# Patient Record
Sex: Male | Born: 1970 | Race: White | Hispanic: No | Marital: Single | Smoking: Current every day smoker
Health system: Southern US, Community
[De-identification: ages and names within clinical notes are randomized; demographics above are authoritative.]

## PROBLEM LIST (undated history)

## (undated) DIAGNOSIS — F1010 Alcohol abuse, uncomplicated: Secondary | ICD-10-CM

## (undated) DIAGNOSIS — J189 Pneumonia, unspecified organism: Secondary | ICD-10-CM

## (undated) DIAGNOSIS — I10 Essential (primary) hypertension: Secondary | ICD-10-CM

## (undated) HISTORY — PX: BACK SURGERY: SHX140

---

## 2020-08-27 ENCOUNTER — Emergency Department (HOSPITAL_BASED_OUTPATIENT_CLINIC_OR_DEPARTMENT_OTHER): Payer: BC Managed Care – PPO

## 2020-08-27 ENCOUNTER — Inpatient Hospital Stay (HOSPITAL_BASED_OUTPATIENT_CLINIC_OR_DEPARTMENT_OTHER)
Admission: EM | Admit: 2020-08-27 | Discharge: 2020-09-19 | DRG: 329 | Disposition: A | Discharge status: Home Health | Payer: BC Managed Care – PPO | Attending: Surgery | Admitting: Surgery

## 2020-08-27 ENCOUNTER — Other Ambulatory Visit: Payer: Self-pay

## 2020-08-27 ENCOUNTER — Encounter (HOSPITAL_BASED_OUTPATIENT_CLINIC_OR_DEPARTMENT_OTHER): Payer: Self-pay

## 2020-08-27 DIAGNOSIS — E871 Hypo-osmolality and hyponatremia: Secondary | ICD-10-CM | POA: Diagnosis present

## 2020-08-27 DIAGNOSIS — K2970 Gastritis, unspecified, without bleeding: Secondary | ICD-10-CM | POA: Diagnosis present

## 2020-08-27 DIAGNOSIS — K760 Fatty (change of) liver, not elsewhere classified: Secondary | ICD-10-CM | POA: Diagnosis present

## 2020-08-27 DIAGNOSIS — Z8616 Personal history of COVID-19: Secondary | ICD-10-CM

## 2020-08-27 DIAGNOSIS — K449 Diaphragmatic hernia without obstruction or gangrene: Secondary | ICD-10-CM | POA: Diagnosis present

## 2020-08-27 DIAGNOSIS — K3184 Gastroparesis: Secondary | ICD-10-CM | POA: Diagnosis present

## 2020-08-27 DIAGNOSIS — Z20822 Contact with and (suspected) exposure to covid-19: Secondary | ICD-10-CM | POA: Diagnosis present

## 2020-08-27 DIAGNOSIS — Z0189 Encounter for other specified special examinations: Secondary | ICD-10-CM

## 2020-08-27 DIAGNOSIS — K2980 Duodenitis without bleeding: Secondary | ICD-10-CM | POA: Diagnosis present

## 2020-08-27 DIAGNOSIS — K56609 Unspecified intestinal obstruction, unspecified as to partial versus complete obstruction: Secondary | ICD-10-CM | POA: Diagnosis present

## 2020-08-27 DIAGNOSIS — F1010 Alcohol abuse, uncomplicated: Secondary | ICD-10-CM | POA: Diagnosis present

## 2020-08-27 DIAGNOSIS — D72829 Elevated white blood cell count, unspecified: Secondary | ICD-10-CM | POA: Diagnosis present

## 2020-08-27 DIAGNOSIS — K56699 Other intestinal obstruction unspecified as to partial versus complete obstruction: Secondary | ICD-10-CM | POA: Diagnosis not present

## 2020-08-27 DIAGNOSIS — Z789 Other specified health status: Secondary | ICD-10-CM

## 2020-08-27 DIAGNOSIS — I10 Essential (primary) hypertension: Secondary | ICD-10-CM | POA: Diagnosis present

## 2020-08-27 DIAGNOSIS — F17210 Nicotine dependence, cigarettes, uncomplicated: Secondary | ICD-10-CM | POA: Diagnosis present

## 2020-08-27 DIAGNOSIS — K567 Ileus, unspecified: Secondary | ICD-10-CM | POA: Diagnosis not present

## 2020-08-27 DIAGNOSIS — E43 Unspecified severe protein-calorie malnutrition: Secondary | ICD-10-CM | POA: Diagnosis present

## 2020-08-27 DIAGNOSIS — Z6823 Body mass index (BMI) 23.0-23.9, adult: Secondary | ICD-10-CM | POA: Diagnosis not present

## 2020-08-27 DIAGNOSIS — Z79899 Other long term (current) drug therapy: Secondary | ICD-10-CM

## 2020-08-27 DIAGNOSIS — E876 Hypokalemia: Secondary | ICD-10-CM | POA: Diagnosis not present

## 2020-08-27 DIAGNOSIS — R1310 Dysphagia, unspecified: Secondary | ICD-10-CM | POA: Diagnosis present

## 2020-08-27 DIAGNOSIS — R112 Nausea with vomiting, unspecified: Secondary | ICD-10-CM

## 2020-08-27 DIAGNOSIS — K5792 Diverticulitis of intestine, part unspecified, without perforation or abscess without bleeding: Secondary | ICD-10-CM | POA: Diagnosis present

## 2020-08-27 DIAGNOSIS — Z978 Presence of other specified devices: Secondary | ICD-10-CM

## 2020-08-27 DIAGNOSIS — K9189 Other postprocedural complications and disorders of digestive system: Secondary | ICD-10-CM

## 2020-08-27 DIAGNOSIS — K3189 Other diseases of stomach and duodenum: Secondary | ICD-10-CM | POA: Diagnosis not present

## 2020-08-27 DIAGNOSIS — K2210 Ulcer of esophagus without bleeding: Secondary | ICD-10-CM | POA: Diagnosis not present

## 2020-08-27 DIAGNOSIS — R935 Abnormal findings on diagnostic imaging of other abdominal regions, including retroperitoneum: Secondary | ICD-10-CM | POA: Diagnosis not present

## 2020-08-27 DIAGNOSIS — D7589 Other specified diseases of blood and blood-forming organs: Secondary | ICD-10-CM | POA: Diagnosis present

## 2020-08-27 DIAGNOSIS — K5720 Diverticulitis of large intestine with perforation and abscess without bleeding: Secondary | ICD-10-CM | POA: Diagnosis present

## 2020-08-27 HISTORY — DX: Essential (primary) hypertension: I10

## 2020-08-27 HISTORY — DX: Alcohol abuse, uncomplicated: F10.10

## 2020-08-27 LAB — CBC WITH DIFFERENTIAL/PLATELET
Abs Immature Granulocytes: 0.07 10*3/uL (ref 0.00–0.07)
Basophils Absolute: 0.1 10*3/uL (ref 0.0–0.1)
Basophils Relative: 0 %
Eosinophils Absolute: 0.1 10*3/uL (ref 0.0–0.5)
Eosinophils Relative: 1 %
HCT: 40.1 % (ref 39.0–52.0)
Hemoglobin: 13.6 g/dL (ref 13.0–17.0)
Immature Granulocytes: 0 %
Lymphocytes Relative: 8 %
Lymphs Abs: 1.5 10*3/uL (ref 0.7–4.0)
MCH: 35.6 pg — ABNORMAL HIGH (ref 26.0–34.0)
MCHC: 33.9 g/dL (ref 30.0–36.0)
MCV: 105 fL — ABNORMAL HIGH (ref 80.0–100.0)
Monocytes Absolute: 1.2 10*3/uL — ABNORMAL HIGH (ref 0.1–1.0)
Monocytes Relative: 6 %
Neutro Abs: 16.8 10*3/uL — ABNORMAL HIGH (ref 1.7–7.7)
Neutrophils Relative %: 85 %
Platelets: 690 10*3/uL — ABNORMAL HIGH (ref 150–400)
RBC: 3.82 MIL/uL — ABNORMAL LOW (ref 4.22–5.81)
RDW: 14.4 % (ref 11.5–15.5)
WBC: 19.7 10*3/uL — ABNORMAL HIGH (ref 4.0–10.5)
nRBC: 0 % (ref 0.0–0.2)

## 2020-08-27 LAB — COMPREHENSIVE METABOLIC PANEL
ALT: 39 U/L (ref 0–44)
AST: 26 U/L (ref 15–41)
Albumin: 3.7 g/dL (ref 3.5–5.0)
Alkaline Phosphatase: 78 U/L (ref 38–126)
Anion gap: 9 (ref 5–15)
BUN: 9 mg/dL (ref 6–20)
CO2: 31 mmol/L (ref 22–32)
Calcium: 9.8 mg/dL (ref 8.9–10.3)
Chloride: 92 mmol/L — ABNORMAL LOW (ref 98–111)
Creatinine, Ser: 0.69 mg/dL (ref 0.61–1.24)
GFR, Estimated: >60 mL/min (ref >60)
Glucose, Bld: 108 mg/dL — ABNORMAL HIGH (ref 70–99)
Potassium: 4.2 mmol/L (ref 3.5–5.1)
Sodium: 132 mmol/L — ABNORMAL LOW (ref 135–145)
Total Bilirubin: 0.7 mg/dL (ref 0.3–1.2)
Total Protein: 7.5 g/dL (ref 6.5–8.1)

## 2020-08-27 LAB — RESP PANEL BY RT-PCR (FLU A&B, COVID) ARPGX2
Influenza A by PCR: NEGATIVE
Influenza B by PCR: NEGATIVE
SARS Coronavirus 2 by RT PCR: NEGATIVE

## 2020-08-27 LAB — LIPASE, BLOOD: Lipase: 25 U/L (ref 11–51)

## 2020-08-27 MED ORDER — DIPHENHYDRAMINE HCL 12.5 MG/5ML PO ELIX: 12.5000 mg | ORAL_SOLUTION | Freq: Four times a day (QID) | ORAL | Status: DC | PRN

## 2020-08-27 MED ORDER — SODIUM CHLORIDE 0.9 % IV SOLN: INTRAVENOUS | Status: DC

## 2020-08-27 MED ORDER — SODIUM CHLORIDE 0.9 % IV SOLN
INTRAVENOUS | Status: AC
  Administered 2020-08-28: 999 mL via INTRAVENOUS
  Administered 2020-09-01: 100 mL/h via INTRAVENOUS

## 2020-08-27 MED ORDER — METOPROLOL TARTRATE 25 MG PO TABS: 25.0000 mg | ORAL_TABLET | Freq: Two times a day (BID) | ORAL | Status: DC

## 2020-08-27 MED ORDER — ACETAMINOPHEN 325 MG PO TABS: 650.0000 mg | ORAL_TABLET | Freq: Four times a day (QID) | ORAL | Status: DC | PRN

## 2020-08-27 MED ORDER — PROCHLORPERAZINE MALEATE 10 MG PO TABS
10.0000 mg | ORAL_TABLET | Freq: Four times a day (QID) | ORAL | Status: DC | PRN
  Filled 2020-08-27: qty 1

## 2020-08-27 MED ORDER — TRAZODONE HCL 50 MG PO TABS
50.0000 mg | ORAL_TABLET | Freq: Every evening | ORAL | Status: DC | PRN
  Administered 2020-09-16 – 2020-09-18 (×3): 50 mg via ORAL
  Filled 2020-08-27 (×6): qty 1

## 2020-08-27 MED ORDER — DIAZEPAM 5 MG/ML PO CONC
10.0000 mg | Freq: Every day | ORAL | Status: DC | PRN
  Filled 2020-08-27: qty 2

## 2020-08-27 MED ORDER — KCL IN DEXTROSE-NACL 20-5-0.45 MEQ/L-%-% IV SOLN
INTRAVENOUS | Status: DC
  Filled 2020-08-27: qty 1000

## 2020-08-27 MED ORDER — METHOCARBAMOL 1000 MG/10ML IJ SOLN
500.0000 mg | Freq: Four times a day (QID) | INTRAVENOUS | Status: DC | PRN
  Administered 2020-08-27 – 2020-08-29 (×3): 500 mg via INTRAVENOUS
  Filled 2020-08-27: qty 5
  Filled 2020-08-27 (×2): qty 500

## 2020-08-27 MED ORDER — PROCHLORPERAZINE EDISYLATE 10 MG/2ML IJ SOLN
5.0000 mg | Freq: Four times a day (QID) | INTRAMUSCULAR | Status: DC | PRN
  Administered 2020-09-01 – 2020-09-02 (×3): 10 mg via INTRAVENOUS
  Filled 2020-08-27 (×3): qty 2

## 2020-08-27 MED ORDER — PIPERACILLIN-TAZOBACTAM 3.375 G IVPB
3.3750 g | Freq: Once | INTRAVENOUS | Status: AC
  Administered 2020-08-27: 3.375 g via INTRAVENOUS
  Filled 2020-08-27: qty 50

## 2020-08-27 MED ORDER — PANTOPRAZOLE SODIUM 40 MG IV SOLR
40.0000 mg | Freq: Every day | INTRAVENOUS | Status: DC
  Administered 2020-08-28 – 2020-09-17 (×22): 40 mg via INTRAVENOUS
  Filled 2020-08-27 (×22): qty 40

## 2020-08-27 MED ORDER — AMLODIPINE BESYLATE 5 MG PO TABS
5.0000 mg | ORAL_TABLET | Freq: Every day | ORAL | Status: DC
  Administered 2020-08-28 – 2020-09-19 (×21): 5 mg via ORAL
  Filled 2020-08-27 (×22): qty 1

## 2020-08-27 MED ORDER — ENOXAPARIN SODIUM 40 MG/0.4ML IJ SOSY
40.0000 mg | PREFILLED_SYRINGE | INTRAMUSCULAR | Status: DC
  Administered 2020-08-28: 40 mg via SUBCUTANEOUS
  Filled 2020-08-27: qty 0.4

## 2020-08-27 MED ORDER — PIPERACILLIN-TAZOBACTAM 3.375 G IVPB
3.3750 g | Freq: Three times a day (TID) | INTRAVENOUS | Status: DC
  Administered 2020-08-29 – 2020-09-06 (×26): 3.375 g via INTRAVENOUS
  Filled 2020-08-27 (×26): qty 50

## 2020-08-27 MED ORDER — ACETAMINOPHEN 650 MG RE SUPP: 650.0000 mg | Freq: Four times a day (QID) | RECTAL | Status: DC | PRN

## 2020-08-27 MED ORDER — HYDROMORPHONE HCL 1 MG/ML IJ SOLN
0.5000 mg | INTRAMUSCULAR | Status: DC | PRN
  Administered 2020-08-27: 0.5 mg via INTRAVENOUS
  Administered 2020-08-27 – 2020-08-28 (×2): 1 mg via INTRAVENOUS
  Administered 2020-08-28 (×3): 2 mg via INTRAVENOUS
  Administered 2020-08-28 (×2): 1 mg via INTRAVENOUS
  Administered 2020-08-29: 2 mg via INTRAVENOUS
  Filled 2020-08-27: qty 2
  Filled 2020-08-27: qty 1
  Filled 2020-08-27: qty 2
  Filled 2020-08-27 (×4): qty 1
  Filled 2020-08-27 (×2): qty 2

## 2020-08-27 MED ORDER — IOHEXOL 300 MG/ML  SOLN
80.0000 mL | Freq: Once | INTRAMUSCULAR | Status: AC | PRN
  Administered 2020-08-27: 80 mL via INTRAVENOUS

## 2020-08-27 MED ORDER — DIPHENHYDRAMINE HCL 50 MG/ML IJ SOLN
12.5000 mg | Freq: Four times a day (QID) | INTRAMUSCULAR | Status: DC | PRN
  Administered 2020-09-06: 12.5 mg via INTRAVENOUS
  Filled 2020-08-27: qty 1

## 2020-08-27 NOTE — H&P (Signed)
Ryan Velasquez is an 50 y.o. male.   Chief Complaint: nausea/vomiting/abdominal pain HPI:  Pt is a 50 yo M who presented to [facility] today with 2 days of n/v and abdominal pain following several days of constipation.  The last few days he also feels that he may have had a fever.  He is a current smoker.  His only real medical issue at this point is HTN.  He does have a history of alcohol abuse, but quit 3 weeks ago. His pain is worse when he moves and better when he is still.    He hasn't had a colonoscopy yet.   Past Medical History:  Diagnosis Date   Alcohol abuse    Hypertension     Past Surgical History:  Procedure Laterality Date   BACK SURGERY     BACK SURGERY     L4-5 herniated disc   FH: Pt denies family h/o cancer  Social History:  reports that he has been smoking cigarettes. He has a 20.00 pack-year smoking history. His smokeless tobacco use includes snuff. He reports current alcohol use. No history on file for drug use.  Allergies: No Known Allergies  Medications Prior to Admission  Medication Sig Dispense Refill   ALLERGY RELIEF 10 MG tablet Take 10 mg by mouth daily.     amLODipine (NORVASC) 5 MG tablet Take 5 mg by mouth daily.     bisacodyl (DULCOLAX) 10 MG suppository Place 10 mg rectally as needed for moderate constipation.     bisacodyl 5 MG EC tablet Take 5 mg by mouth daily as needed for moderate constipation.     diazepam (VALIUM) 5 MG/ML solution Take 10 mg by mouth daily as needed for anxiety.     docusate sodium (COLACE) 100 MG capsule Take 100 mg by mouth daily as needed for mild constipation.     losartan (COZAAR) 25 MG tablet Take 25 mg by mouth 2 (two) times daily.     magnesium hydroxide (MILK OF MAGNESIA) 400 MG/5ML suspension Take 30 mLs by mouth daily as needed for mild constipation.     Multiple Vitamins-Minerals (MULTI COMPLETE) CAPS Take 1 capsule by mouth daily.     omeprazole (PRILOSEC) 20 MG capsule Take 20 mg by mouth daily.      ondansetron (ZOFRAN-ODT) 8 MG disintegrating tablet Take 8 mg by mouth every 8 (eight) hours as needed for nausea or vomiting.     thiamine 100 MG tablet Take 100 mg by mouth daily.     traZODone (DESYREL) 50 MG tablet Take 50 mg by mouth at bedtime as needed for sleep.     metoprolol tartrate (LOPRESSOR) 25 MG tablet Take 25 mg by mouth 2 (two) times daily.     potassium chloride (KLOR-CON) 10 MEQ tablet Take 10 mEq by mouth daily.      Results for orders placed or performed during the hospital encounter of 08/27/20 (from the past 48 hour(s))  Lipase, blood     Status: None   Collection Time: 08/27/20 11:25 AM  Result Value Ref Range   Lipase 25 11 - 51 U/L    Comment: Performed at [facility], [address], [place], [place] [address]  Comprehensive metabolic panel     Status: Abnormal   Collection Time: 08/27/20 11:25 AM  Result Value Ref Range   Sodium 132 (L) 135 - 145 mmol/L   Potassium 4.2 3.5 - 5.1 mmol/L   Chloride 92 (L) 98 - 111 mmol/L  CO2 31 22 - 32 mmol/L   Glucose, Bld 108 (H) 70 - 99 mg/dL    Comment: Glucose reference range applies only to samples taken after fasting for at least 8 hours.   BUN 9 6 - 20 mg/dL   Creatinine, Ser 7.82 0.61 - 1.24 mg/dL   Calcium 9.8 8.9 - 95.6 mg/dL   Total Protein 7.5 6.5 - 8.1 g/dL   Albumin 3.7 3.5 - 5.0 g/dL   AST 26 15 - 41 U/L   ALT 39 0 - 44 U/L   Alkaline Phosphatase 78 38 - 126 U/L   Total Bilirubin 0.7 0.3 - 1.2 mg/dL   GFR, Estimated >21 >30 mL/min    Comment: (NOTE) Calculated using the CKD-EPI Creatinine Equation (2021)    Anion gap 9 5 - 15    Comment: Performed at [facility], [address], [place], [place] [address]  CBC with Differential/Platelet     Status: Abnormal   Collection Time: 08/27/20 11:25 AM  Result Value Ref Range   WBC 19.7 (H) 4.0 - 10.5 K/uL   RBC 3.82 (L) 4.22 - 5.81 MIL/uL   Hemoglobin 13.6 13.0 - 17.0 g/dL   HCT 46.9 62.9 - 52.8 %   MCV 105.0  (H) 80.0 - 100.0 fL   MCH 35.6 (H) 26.0 - 34.0 pg   MCHC 33.9 30.0 - 36.0 g/dL   RDW 41.3 24.4 - 01.0 %   Platelets 690 (H) 150 - 400 K/uL   nRBC 0.0 0.0 - 0.2 %   Neutrophils Relative % 85 %   Neutro Abs 16.8 (H) 1.7 - 7.7 K/uL   Lymphocytes Relative 8 %   Lymphs Abs 1.5 0.7 - 4.0 K/uL   Monocytes Relative 6 %   Monocytes Absolute 1.2 (H) 0.1 - 1.0 K/uL   Eosinophils Relative 1 %   Eosinophils Absolute 0.1 0.0 - 0.5 K/uL   Basophils Relative 0 %   Basophils Absolute 0.1 0.0 - 0.1 K/uL   Immature Granulocytes 0 %   Abs Immature Granulocytes 0.07 0.00 - 0.07 K/uL    Comment: Performed at [facility], [address], [place], [place] [address]  Resp Panel by RT-PCR (Flu A&B, Covid) Nasopharyngeal Swab     Status: None   Collection Time: 08/27/20  3:20 PM   Specimen: Nasopharyngeal Swab; Nasopharyngeal(NP) swabs in vial transport medium  Result Value Ref Range   SARS Coronavirus 2 by RT PCR NEGATIVE NEGATIVE    Comment: (NOTE) SARS-CoV-2 target nucleic acids are NOT DETECTED.  The SARS-CoV-2 RNA is generally detectable in upper respiratory specimens during the acute phase of infection. The lowest concentration of SARS-CoV-2 viral copies this assay can detect is 138 copies/mL. A negative result does not preclude SARS-Cov-2 infection and should not be used as the sole basis for treatment or other patient management decisions. A negative result may occur with  improper specimen collection/handling, submission of specimen other than nasopharyngeal swab, presence of viral mutation(s) within the areas targeted by this assay, and inadequate number of viral copies(<138 copies/mL). A negative result must be combined with clinical observations, patient history, and epidemiological information. The expected result is Negative.  Fact Sheet for Patients:  BloggerCourse.com  Fact Sheet for Healthcare Providers:   SeriousBroker.it  This test is no t yet approved or cleared by the [place] FDA and  has been authorized for detection and/or diagnosis of SARS-CoV-2 by FDA under an Emergency Use Authorization (EUA). This EUA will remain  in  effect (meaning this test can be used) for the duration of the COVID-19 declaration under Section 564(b)(1) of the Act, 21 U.S.C.section 360bbb-3(b)(1), unless the authorization is terminated  or revoked sooner.       Influenza A by PCR NEGATIVE NEGATIVE   Influenza B by PCR NEGATIVE NEGATIVE    Comment: (NOTE) The Xpert Xpress SARS-CoV-2/FLU/RSV plus assay is intended as an aid in the diagnosis of influenza from Nasopharyngeal swab specimens and should not be used as a sole basis for treatment. Nasal washings and aspirates are unacceptable for Xpert Xpress SARS-CoV-2/FLU/RSV testing.  Fact Sheet for Patients: BloggerCourse.comhttps://www.fda.gov/media/152166/download  Fact Sheet for Healthcare Providers: SeriousBroker.ithttps://www.fda.gov/media/152162/download  This test is not yet approved or cleared by the [place] FDA and has been authorized for detection and/or diagnosis of SARS-CoV-2 by FDA under an Emergency Use Authorization (EUA). This EUA will remain in effect (meaning this test can be used) for the duration of the COVID-19 declaration under Section 564(b)(1) of the Act, 21 U.S.C. section 360bbb-3(b)(1), unless the authorization is terminated or revoked.  Performed at [facility], [address], [place], [place] [id number]    CT Abdomen Pelvis W Contrast  Result Date: 08/27/2020 CLINICAL DATA:  Abdominal pain beginning yesterday. Five day history of constipation. Two episodes of vomiting yesterday. EXAM: CT ABDOMEN AND PELVIS WITH CONTRAST TECHNIQUE: Multidetector CT imaging of the abdomen and pelvis was performed using the standard protocol following bolus administration of intravenous contrast. CONTRAST:  80mL  OMNIPAQUE IOHEXOL 300 MG/ML  SOLN COMPARISON:  None. FINDINGS: Lower chest: Lung bases essentially clear.  Heart normal in size. Hepatobiliary: Liver normal in size. Diffuse decreased attenuation of the liver consistent with fatty infiltration. Subcentimeter low-attenuation lesion in the anterior right lobe at the liver dome, too small to characterize, but consistent with a cyst. No other liver masses or lesions. Gallbladder mostly collapsed. No bile duct dilation. Pancreas: Unremarkable. No pancreatic ductal dilatation or surrounding inflammatory changes. Spleen: Normal in size without focal abnormality. Adrenals/Urinary Tract: Adrenal glands are unremarkable. Kidneys are normal, without renal calculi, focal lesion, or hydronephrosis. Bladder is unremarkable. Stomach/Bowel: There is irregular wall thickening and adjacent inflammation of the mid to distal sigmoid colon, where there are several small diverticula. There is an extraluminal collection containing fluid and air between the sigmoid colon and dome of the bladder, collection measuring 4.7 x 3.1 x 4.3 cm in size. There is no free air. Colon above this level is dilated with air-fluid levels, greatest dilation noted of the ascending colon just above the ileocecal valve, 10 cm in diameter. No other areas of colonic wall thickening or inflammation. Normal stomach. Small bowel normal in caliber thoracic entirety. No wall thickening. No inflammation. Normal appendix visualized. Vascular/Lymphatic: Aortic atherosclerosis. No aneurysm. No enlarged lymph nodes. Reproductive: Unremarkable. Other: Trace amount of ascites. Ill-defined opacity noted in the anterior upper abdominal peritoneal cavity, likely along the greater omentum, approximately 1.8 cm in greatest dimension. This is nonspecific. Peritoneal neoplastic disease is possible. Musculoskeletal: No fracture or acute finding.  No bone lesion. IMPRESSION: 1. Partial distal colonic obstruction. This is due to  pathology along the mid to distal sigmoid colon. This segment of colon appears inflamed with irregular wall thickening and adjacent extraluminal collection, the latter finding, 4.7 cm in greatest dimension, consistent with a peridiverticular abscess. 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 However, sigmoid colon carcinoma remains in the differential diagnosis, with secondary inflammation. 2. Trace amount of ascites. Small focus of hypoattenuation in the upper  anterior peritoneal cavity, nonspecific. This could reflect a focus of peritoneal carcinomatosis if the sigmoid colon pathology is due to neoplastic disease. 3. No other evidence of an acute abnormality within the abdomen or pelvis. 4. Hepatic steatosis. Electronically Signed   By: Amie Portland M.D.   On: 08/27/2020 13:38    Review of Systems  Constitutional: Negative.   HENT: Negative.    Eyes: Negative.   Respiratory: Negative.    Cardiovascular: Negative.   Gastrointestinal:  Positive for abdominal distention, constipation, nausea and vomiting.  Endocrine: Negative.   Genitourinary: Negative.   Musculoskeletal: Negative.   Skin: Negative.   Allergic/Immunologic: Negative.   Neurological: Negative.   Hematological: Negative.   Psychiatric/Behavioral: Negative.    All other systems reviewed and are negative.  Blood pressure 140/78, pulse 87, temperature 98.6 F (37 C), temperature source Oral, resp. rate 18, height 5\' 8"  (1.727 m), weight 74.8 kg, SpO2 97 %. Physical Exam Vitals reviewed.  Constitutional:      General: He is in acute distress.     Appearance: He is well-developed and normal weight. He is ill-appearing. He is not toxic-appearing or diaphoretic.  HENT:     Head: Normocephalic and atraumatic.     Mouth/Throat:     Mouth: Mucous membranes are moist.  Eyes:     General: No scleral icterus.    Extraocular Movements: Extraocular movements intact.     Pupils: Pupils are equal, round, and  reactive to light.  Cardiovascular:     Rate and Rhythm: Normal rate and regular rhythm.     Heart sounds: Normal heart sounds. No murmur heard.   No gallop.  Pulmonary:     Effort: Pulmonary effort is normal. No respiratory distress.     Breath sounds: Normal breath sounds. No wheezing.  Abdominal:     General: Bowel sounds are decreased. There is distension. There are no signs of injury.     Palpations: Abdomen is soft. There is no fluid wave, hepatomegaly, splenomegaly or mass.     Tenderness: There is generalized abdominal tenderness (mild right sided tenderness).  Skin:    General: Skin is warm and dry.     Capillary Refill: Capillary refill takes 2 to 3 seconds.     Coloration: Skin is not cyanotic, jaundiced, mottled or pale.     Findings: No erythema or rash.  Neurological:     General: No focal deficit present.     Mental Status: He is alert and oriented to person, place, and time.     Motor: No weakness.  Psychiatric:        Mood and Affect: Mood is anxious. Mood is not depressed.        Behavior: Behavior normal.     Assessment/Plan Colonic obstruction with inflammation and abscess. Unclear etiology Diverticulitis vs cancer. Ileocecal valve appears incompetent.   NGT for decompression if he develops worse pain or n/v. Consider enemas and GI consult for flex sig.  This would help determine if there is reasonable chance for inflammation to resolve to get to formal colonoscopy. Concern with appearance of scan that there may be a cancer there.   IV antibiotics. NPO IV fluids    , MD FACS Surgical Oncology, General Surgery, Trauma and Critical [facility], [facility] [place] for weekday/non holidays Check [email or web address] for coverage night/weekend/holidays  Do not use SecureChat as it is not reliable for timely patient care.

## 2020-08-27 NOTE — ED Notes (Signed)
Report called to Asher Muir, RN on [facility] at [facility].

## 2020-08-27 NOTE — ED Provider Notes (Signed)
[facility] EMERGENCY DEPT Provider Note   CSN: [id number] Arrival date & time:        History Chief Complaint  Patient presents with   Abdominal Pain    Romain Erion is a 50 y.o. male.  Patient presenting with a complaint of 5 days of constipation and abdominal pain starting yesterday.  Right-sided abdominal pain.  Had nausea and vomiting yesterday x2.  Patient has not had any symptoms like this in the past.  Past medical history sniffing for hypertension is on medication for that.  And a history of alcohol use.  Patient also thinks he has a low-grade fever.      Past Medical History:  Diagnosis Date   Alcohol abuse    Hypertension     Patient Active Problem List   Diagnosis Date Noted   Diverticulitis 08/27/2020         No family history on file.  Social History   Tobacco Use   Smoking status: Every Day    Packs/day: 1.00    Years: 20.00    Pack years: 20.00    Types: Cigarettes   Smokeless tobacco: Current    Types: Snuff  Vaping Use   Vaping Use: Never used  Substance Use Topics   Alcohol use: Yes    Comment: last drink approximately 1 1/2 weeks ago, history of alcohol abuse    Home Medications Prior to Admission medications   Not on File    Allergies    Patient has no known allergies.  Review of Systems   Review of Systems  Constitutional:  Positive for fever. Negative for chills.  HENT:  Negative for ear pain and sore throat.   Eyes:  Negative for pain and visual disturbance.  Respiratory:  Negative for cough and shortness of breath.   Cardiovascular:  Negative for chest pain and palpitations.  Gastrointestinal:  Positive for abdominal pain, nausea and vomiting.  Genitourinary:  Negative for dysuria and hematuria.  Musculoskeletal:  Negative for arthralgias and back pain.  Skin:  Negative for color change and rash.  Neurological:  Negative for seizures and syncope.  All other systems reviewed and are negative.  Physical  Exam Updated Vital Signs BP 138/77 (BP Location: Right Arm)   Pulse 92   Temp 98.9 F (37.2 C) (Oral)   Resp 16   Ht 1.727 m (5\' 8" )   Wt 74.8 kg   SpO2 98%   BMI 25.09 kg/m   Physical Exam Vitals and nursing note reviewed.  Constitutional:      Appearance: He is well-developed.  HENT:     Head: Normocephalic and atraumatic.  Eyes:     Conjunctiva/sclera: Conjunctivae normal.  Cardiovascular:     Rate and Rhythm: Normal rate and regular rhythm.     Heart sounds: No murmur heard. Pulmonary:     Effort: Pulmonary effort is normal. No respiratory distress.     Breath sounds: Normal breath sounds.  Abdominal:     General: There is no distension.     Palpations: Abdomen is soft.     Tenderness: There is abdominal tenderness in the right lower quadrant. There is guarding.  Musculoskeletal:     Cervical back: Neck supple.  Skin:    General: Skin is warm and dry.  Neurological:     General: No focal deficit present.     Mental Status: He is alert and oriented to person, place, and time.    ED Results / Procedures / Treatments  Labs (all labs ordered are listed, but only abnormal results are displayed) Labs Reviewed  COMPREHENSIVE METABOLIC PANEL - Abnormal; Notable for the following components:      Result Value   Sodium 132 (*)    Chloride 92 (*)    Glucose, Bld 108 (*)    All other components within normal limits  CBC WITH DIFFERENTIAL/PLATELET - Abnormal; Notable for the following components:   WBC 19.7 (*)    RBC 3.82 (*)    MCV 105.0 (*)    MCH 35.6 (*)    Platelets 690 (*)    Neutro Abs 16.8 (*)    Monocytes Absolute 1.2 (*)    All other components within normal limits  RESP PANEL BY RT-PCR (FLU A&B, COVID) ARPGX2  LIPASE, BLOOD    EKG None  Radiology CT Abdomen Pelvis W Contrast  Result Date: 08/27/2020 CLINICAL DATA:  Abdominal pain beginning yesterday. Five day history of constipation. Two episodes of vomiting yesterday. EXAM: CT ABDOMEN AND  PELVIS WITH CONTRAST TECHNIQUE: Multidetector CT imaging of the abdomen and pelvis was performed using the standard protocol following bolus administration of intravenous contrast. CONTRAST:  28mL OMNIPAQUE IOHEXOL 300 MG/ML  SOLN COMPARISON:  None. FINDINGS: Lower chest: Lung bases essentially clear.  Heart normal in size. Hepatobiliary: Liver normal in size. Diffuse decreased attenuation of the liver consistent with fatty infiltration. Subcentimeter low-attenuation lesion in the anterior right lobe at the liver dome, too small to characterize, but consistent with a cyst. No other liver masses or lesions. Gallbladder mostly collapsed. No bile duct dilation. Pancreas: Unremarkable. No pancreatic ductal dilatation or surrounding inflammatory changes. Spleen: Normal in size without focal abnormality. Adrenals/Urinary Tract: Adrenal glands are unremarkable. Kidneys are normal, without renal calculi, focal lesion, or hydronephrosis. Bladder is unremarkable. Stomach/Bowel: There is irregular wall thickening and adjacent inflammation of the mid to distal sigmoid colon, where there are several small diverticula. There is an extraluminal collection containing fluid and air between the sigmoid colon and dome of the bladder, collection measuring 4.7 x 3.1 x 4.3 cm in size. There is no free air. Colon above this level is dilated with air-fluid levels, greatest dilation noted of the ascending colon just above the ileocecal valve, 10 cm in diameter. No other areas of colonic wall thickening or inflammation. Normal stomach. Small bowel normal in caliber thoracic entirety. No wall thickening. No inflammation. Normal appendix visualized. Vascular/Lymphatic: Aortic atherosclerosis. No aneurysm. No enlarged lymph nodes. Reproductive: Unremarkable. Other: Trace amount of ascites. Ill-defined opacity noted in the anterior upper abdominal peritoneal cavity, likely along the greater omentum, approximately 1.8 cm in greatest dimension.  This is nonspecific. Peritoneal neoplastic disease is possible. Musculoskeletal: No fracture or acute finding.  No bone lesion. IMPRESSION: 1. Partial distal colonic obstruction. This is due to pathology along the mid to distal sigmoid colon. This segment of colon appears inflamed with irregular wall thickening and adjacent extraluminal collection, the latter finding, 4.7 cm in greatest dimension, consistent with a peridiverticular abscess. 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 However, sigmoid colon carcinoma remains in the differential diagnosis, with secondary inflammation. 2. Trace amount of ascites. Small focus of hypoattenuation in the upper anterior peritoneal cavity, nonspecific. This could reflect a focus of peritoneal carcinomatosis if the sigmoid colon pathology is due to neoplastic disease. 3. No other evidence of an acute abnormality within the abdomen or pelvis. 4. Hepatic steatosis. Electronically Signed   By: Amie Portland M.D.   On: 08/27/2020 13:38  Procedures Procedures   CRITICAL CARE Performed by: Vanetta Mulders Total critical care time: 35 minutes Critical care time was exclusive of separately billable procedures and treating other patients. Critical care was necessary to treat or prevent imminent or life-threatening deterioration. Critical care was time spent personally by me on the following activities: development of treatment plan with patient and/or surrogate as well as nursing, discussions with consultants, evaluation of patient's response to treatment, examination of patient, obtaining history from patient or surrogate, ordering and performing treatments and interventions, ordering and review of laboratory studies, ordering and review of radiographic studies, pulse oximetry and re-evaluation of patient's condition.   Medications Ordered in ED Medications  0.9 %  sodium chloride infusion ( Intravenous New Bag/Given 08/27/20 1152)   piperacillin-tazobactam (ZOSYN) IVPB 3.375 g (3.375 g Intravenous New Bag/Given 08/27/20 1514)  0.9 %  sodium chloride infusion (has no administration in time range)  iohexol (OMNIPAQUE) 300 MG/ML solution 80 mL (80 mLs Intravenous Contrast Given 08/27/20 1308)    ED Course  I have reviewed the triage vital signs and the nursing notes.  Pertinent labs & imaging results that were available during my care of the patient were reviewed by me and considered in my medical decision making (see chart for details).    MDM Rules/Calculators/A&P                         Patient has not had a recent colonoscopy.  Initial concern really was for right-sided abdominal process like appendicitis.  Patient with marked leukocytosis white blood cell count 19,000.  Liver function test are normal.  Electrolytes without significant abnormalities other than some mild hyponatremia with a sodium of 132.  Lipase normal.  COVID testing negative.  CT scan showed distal sigmoid partial obstruction due to inflammatory changes secondary to diverticulitis and an abscess.  Based on this patient treated with Zosyn here.  Contacted [facility] spoke to Dr. Donell Beers.  Patient will be admitted to their service at [facility].  Patient will be transferred.  Patient made NPO.  Final Clinical Impression(s) / ED Diagnoses Final diagnoses:  Diverticulitis    Rx / DC Orders ED Discharge Orders     None        Vanetta Mulders, MD 08/27/20 1708

## 2020-08-27 NOTE — ED Notes (Signed)
Patient ambulated to restroom without difficulty.

## 2020-08-27 NOTE — ED Triage Notes (Signed)
Pt reports a 5 day history of constipation with abdominal pain starting yesterday.  Has tried suppositories, laxatives, and stool softeners with no relief.  Had 2 episodes of vomiting yesterday, emesis was clear.

## 2020-08-28 ENCOUNTER — Inpatient Hospital Stay (HOSPITAL_COMMUNITY): Payer: BC Managed Care – PPO

## 2020-08-28 DIAGNOSIS — K5792 Diverticulitis of intestine, part unspecified, without perforation or abscess without bleeding: Secondary | ICD-10-CM | POA: Diagnosis not present

## 2020-08-28 DIAGNOSIS — K56699 Other intestinal obstruction unspecified as to partial versus complete obstruction: Secondary | ICD-10-CM | POA: Diagnosis not present

## 2020-08-28 DIAGNOSIS — R935 Abnormal findings on diagnostic imaging of other abdominal regions, including retroperitoneum: Secondary | ICD-10-CM | POA: Diagnosis not present

## 2020-08-28 DIAGNOSIS — K56609 Unspecified intestinal obstruction, unspecified as to partial versus complete obstruction: Secondary | ICD-10-CM | POA: Diagnosis not present

## 2020-08-28 LAB — HIV ANTIBODY (ROUTINE TESTING W REFLEX): HIV Screen 4th Generation wRfx: NONREACTIVE

## 2020-08-28 LAB — BASIC METABOLIC PANEL
Anion gap: 9 (ref 5–15)
BUN: 9 mg/dL (ref 6–20)
CO2: 27 mmol/L (ref 22–32)
Calcium: 9.2 mg/dL (ref 8.9–10.3)
Chloride: 95 mmol/L — ABNORMAL LOW (ref 98–111)
Creatinine, Ser: 0.78 mg/dL (ref 0.61–1.24)
GFR, Estimated: >60 mL/min (ref >60)
Glucose, Bld: 97 mg/dL (ref 70–99)
Potassium: 4.5 mmol/L (ref 3.5–5.1)
Sodium: 131 mmol/L — ABNORMAL LOW (ref 135–145)

## 2020-08-28 LAB — MAGNESIUM: Magnesium: 2.4 mg/dL (ref 1.7–2.4)

## 2020-08-28 LAB — PHOSPHORUS: Phosphorus: 4 mg/dL (ref 2.5–4.6)

## 2020-08-28 MED ORDER — NICOTINE 14 MG/24HR TD PT24
14.0000 mg | MEDICATED_PATCH | Freq: Every day | TRANSDERMAL | Status: DC
  Administered 2020-08-28 – 2020-09-19 (×23): 14 mg via TRANSDERMAL
  Filled 2020-08-28 (×24): qty 1

## 2020-08-28 MED ORDER — SIMETHICONE 80 MG PO CHEW
80.0000 mg | CHEWABLE_TABLET | Freq: Four times a day (QID) | ORAL | Status: DC | PRN
  Administered 2020-08-28: 80 mg via ORAL
  Filled 2020-08-28: qty 1

## 2020-08-28 NOTE — Progress Notes (Signed)
NG insertion ordered. 12 Fr tube inserted into right nare with Paulette, RN assist. KUB ordered and completed, results instructed to advance by 6.5 cm. NG tube was then advanced and new KUB has been ordered. Patient tolerated insertion well, premedicated with IV dilaudid 2mg .

## 2020-08-28 NOTE — Progress Notes (Signed)
Patient ambulated two laps around unit. Pain managed with prn dilaudid. Pt remains NPO.

## 2020-08-28 NOTE — Consult Note (Addendum)
[facility] Consult: 10:19 AM 08/28/2020  LOS: 1 day    Referring Provider: CCS MD MD Phylliss Blakes.   Primary Care Physician:  Pcp, No Primary Gastroenterologist:  Dr Marney Setting at [facility].      Reason for Consultation: Colonic obstruction with colon mass vs complicated diverticulitis.  Surgeon requesting limited flex sig/biopsy   HPI: Ryan Velasquez is a 50 y.o. male.  PMH hypertension.  EtOH abuse.  Previous low back surgery.  No regular medical care.  Colonoscopy early 2022.  No previous colonoscopy or EGD.  Referred to and seen by Dr. Marney Setting at [facility] on 08/17/2020 for chronic N/V, dysphagia.  N/V began w diagnosis of COVID-19 in December 2021.  Diarrhea resolved but nausea and vomiting continued along with solid food dysphagia..  Symptoms controlled with Zofran.  Weight loss of unclear quantity.  Reported drinking up to 1 pint liquor daily.  MD plan was for EGD as well as extensive lab testing including IgA (elevated to 416), TTG IgA (<2), CMET, GGT (176), Lipase (121), TSH (normal 4.0), Cortisol (normal 12.3) , CBC w diff, ultrasound abdomen (hepatomegaly, diffuse hepatic steatosis) TTT.  EGD is set up for 10/03/2020.   In the meantime, a week ago he entered [facility].  For about the same amount of time he has had progressive abdominal distention, pain on the right lower quadrant.  Had not had a bowel movement for 6 days.  Nonbloody emesis on 7/4 but otherwise not a significant amount of nausea or any other emesis.  CT angio chest 6/21: Mild esophageal thickening suggesting esophagitis.  Diffuse hepatic steatosis, small HH. WBCs 22.9.  Hb 13.5.  MCV 105.  Platelets 697. Sodium 131.  Normal BUN/creatinine.  LFTs normal. . Pain persists but he has not had any further  vomiting and does not feel nauseous.  NG tube is not in place.  Within the last couple of hours he has passed a large, pudding consistency brown stool but this has not helped the distention or abdominal discomfort.  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  He smokes 1.5 packs/day.  Consumes 1/2 gallon of vodka over the course of 3 days. Family history pertinent for breast cancer in his mother, sister, 2 aunts and some cousins.  Maternal grandfather had bone cancer.  Mother had nonbleeding peptic ulcer disease.  No history of colorectal cancer or inflammatory bowel disease.     Past Medical History:  Diagnosis Date   Alcohol abuse    Hypertension     Past Surgical History:  Procedure Laterality Date   BACK SURGERY     BACK SURGERY     L4-5 herniated disc    Prior to Admission medications   Medication Sig Start Date End Date Taking? Authorizing Provider  ALLERGY RELIEF 10 MG tablet Take 10 mg by mouth daily. 05/28/20  Yes [provider]  amLODipine (NORVASC) 5 MG tablet Take 5 mg by mouth daily. 05/28/20  Yes [provider]  bisacodyl (DULCOLAX) 10 MG suppository  Place 10 mg rectally as needed for moderate constipation.   Yes [provider]  bisacodyl 5 MG EC tablet Take 5 mg by mouth daily as needed for moderate constipation.   Yes [provider]  diazepam (VALIUM) 5 MG/ML solution Take 10 mg by mouth daily as needed (seizures). Give 10mg /27mL IM STAT and call on call medical staff. May repeat dose one time if seizure persists greater than 30 seconds after the first dose given.   Yes [provider]  docusate sodium (COLACE) 100 MG capsule Take 100 mg by mouth daily as needed for mild constipation.   Yes [provider]  losartan (COZAAR) 25 MG tablet Take 25 mg by mouth 2 (two) times daily. 05/10/20  Yes [provider]  magnesium hydroxide (MILK OF MAGNESIA) 400 MG/5ML suspension Take 30 mLs by mouth daily as  needed for mild constipation.   Yes [provider]  Multiple Vitamins-Minerals (MULTI COMPLETE) CAPS Take 1 capsule by mouth daily.   Yes [provider]  omeprazole (PRILOSEC) 20 MG capsule Take 20 mg by mouth daily. 08/15/20  Yes [provider]  ondansetron (ZOFRAN-ODT) 8 MG disintegrating tablet Take 8 mg by mouth every 8 (eight) hours as needed for nausea or vomiting.   Yes [provider]  thiamine 100 MG tablet Take 100 mg by mouth daily.   Yes [provider]  traZODone (DESYREL) 50 MG tablet Take 50 mg by mouth at bedtime as needed for sleep.   Yes [provider]  metoprolol tartrate (LOPRESSOR) 25 MG tablet Take 25 mg by mouth 2 (two) times daily. Patient not taking: Reported on 08/28/2020 03/22/20   [provider]    Scheduled Meds:  amLODipine  5 mg Oral Daily   enoxaparin (LOVENOX) injection  40 mg Subcutaneous Q24H   nicotine  14 mg Transdermal Daily   pantoprazole (PROTONIX) IV  40 mg Intravenous QHS   Infusions:  sodium chloride 100 mL/hr at 08/27/20 1152   sodium chloride 100 mL/hr at 08/27/20 2258   dextrose 5 % and 0.45 % NaCl with KCl 20 mEq/L 100 mL/hr at 08/28/20 0257   methocarbamol (ROBAXIN) IV 500 mg (08/27/20 2305)   piperacillin-tazobactam (ZOSYN)  IV     PRN Meds: acetaminophen **OR** acetaminophen, diazepam, diphenhydrAMINE **OR** diphenhydrAMINE, HYDROmorphone (DILAUDID) injection, methocarbamol (ROBAXIN) IV, prochlorperazine **OR** prochlorperazine, traZODone   Allergies as of 08/27/2020   (No Known Allergies)    No family history on file.  Social History   Socioeconomic History   Marital status: Single    Spouse name: Not on file   Number of children: Not on file   Years of education: Not on file   Highest education level: Not on file  Occupational History   Not on file  Tobacco Use   Smoking status: Every Day    Packs/day: 1.00    Years: 20.00    Pack years: 20.00    Types:  Cigarettes   Smokeless tobacco: Current    Types: Snuff  Vaping Use   Vaping Use: Never used  Substance and Sexual Activity   Alcohol use: Yes    Comment: last drink approximately 1 1/2 weeks ago, history of alcohol abuse   Drug use: Not on file   Sexual activity: Not on file  Other Topics Concern   Not on file  Social History Narrative   Not on file   Social Determinants of Health   Financial Resource Strain: Not on file  Food  Insecurity: Not on file  Transportation Needs: Not on file  Physical Activity: Not on file  Stress: Not on file  Social Connections: Not on file  Intimate Partner Violence: Not on file    REVIEW OF SYSTEMS: Constitutional: No profound weakness or fatigue. ENT:  No nose bleeds Pulm: Shortness of breath or cough. CV:  No palpitations, no LE edema.  GU:  No hematuria, no frequency GI: See HPI. Heme: Denies unusual or excessive bleeding or bruising.  No previous history of anemia. Transfusions: No history of blood transfusions. Neuro:  No headaches, no peripheral tingling or numbness.  No syncope, no seizures. Derm:  No itching, no rash or sores.  Endocrine:  No sweats or chills.  No polyuria or dysuria Immunization: Not queried. Travel:  None beyond local counties in last few months.    PHYSICAL EXAM: Vital signs in last 24 hours: Vitals:   08/28/20 0148 08/28/20 0610  BP: 129/78 (!) 142/84  Pulse: 93 (!) 108  Resp:  17  Temp: 99.4 F (37.4 C) 99.2 F (37.3 C)  SpO2: 96% 95%   Wt Readings from Last 3 Encounters:  08/27/20 74.8 kg    General: Patient does not look unwell.  Resting comfortably on the bed. Head: No facial asymmetry or swelling.  No signs of head trauma. Eyes: No conjunctival pallor.  No scleral icterus.  EOMI Ears: Not hard of hearing Nose: No congestion or discharge Mouth: Good dentition.  Mucosa moist, pink, clear.  Tongue midline. Neck: No JVD, no masses, no thyromegaly Lungs: Clear bilaterally.  No labored  breathing or cough. Heart: Tachycardic, regular.  S1, S2 audible. Abdomen: Tight, distended, tender without guarding in the right abdomen mid to lower area.  No guarding or rebound.  No HSM, masses, bruits appreciated. Rectal: Deferred. Musc/Skeltl: No joint redness, swelling or gross deformity Extremities: No CCE.  Feet are warm with brisk pedal reperfusion. Neurologic: Alert.  Oriented x3.  No tremors, no asterixis.  No gross weakness but formal strength testing not pursued. Skin: No jaundice.  No telangiectasia Nodes: No cervical adenopathy Psych: Cooperative, pleasant, fluid speech.  Intake/Output from previous day: No intake/output data recorded. Intake/Output this shift: No intake/output data recorded.  LAB RESULTS: Recent Labs    08/27/20 1125 08/28/20 0243  WBC 19.7* 22.9*  HGB 13.6 13.5  HCT 40.1 39.4  PLT 690* 697*   BMET Lab Results  Component Value Date   NA 131 (L) 08/28/2020   NA 132 (L) 08/27/2020   K 4.5 08/28/2020   K 4.2 08/27/2020   CL 95 (L) 08/28/2020   CL 92 (L) 08/27/2020   CO2 27 08/28/2020   CO2 31 08/27/2020   GLUCOSE 97 08/28/2020   GLUCOSE 108 (H) 08/27/2020   BUN 9 08/28/2020   BUN 9 08/27/2020   CREATININE 0.78 08/28/2020   CREATININE 0.69 08/27/2020   CALCIUM 9.2 08/28/2020   CALCIUM 9.8 08/27/2020   LFT Recent Labs    08/27/20 1125  PROT 7.5  ALBUMIN 3.7  AST 26  ALT 39  ALKPHOS 78  BILITOT 0.7   PT/INR No results found for: INR, PROTIME Hepatitis Panel No results for input(s): HEPBSAG, HCVAB, HEPAIGM, HEPBIGM in the last 72 hours. C-Diff No components found for: CDIFF Lipase     Component Value Date/Time   LIPASE 25 08/27/2020 1125    Drugs of Abuse  No results found for: LABOPIA, COCAINSCRNUR, LABBENZ, AMPHETMU, THCU, LABBARB   RADIOLOGY STUDIES: CT Abdomen Pelvis W Contrast  Result  Date: 08/27/2020 CLINICAL DATA:  Abdominal pain beginning yesterday. Five day history of constipation. Two episodes of vomiting  yesterday. EXAM: CT ABDOMEN AND PELVIS WITH CONTRAST TECHNIQUE: Multidetector CT imaging of the abdomen and pelvis was performed using the standard protocol following bolus administration of intravenous contrast. CONTRAST:  75mL OMNIPAQUE IOHEXOL 300 MG/ML  SOLN COMPARISON:  None. FINDINGS: Lower chest: Lung bases essentially clear.  Heart normal in size. Hepatobiliary: Liver normal in size. Diffuse decreased attenuation of the liver consistent with fatty infiltration. Subcentimeter low-attenuation lesion in the anterior right lobe at the liver dome, too small to characterize, but consistent with a cyst. No other liver masses or lesions. Gallbladder mostly collapsed. No bile duct dilation. Pancreas: Unremarkable. No pancreatic ductal dilatation or surrounding inflammatory changes. Spleen: Normal in size without focal abnormality. Adrenals/Urinary Tract: Adrenal glands are unremarkable. Kidneys are normal, without renal calculi, focal lesion, or hydronephrosis. Bladder is unremarkable. Stomach/Bowel: There is irregular wall thickening and adjacent inflammation of the mid to distal sigmoid colon, where there are several small diverticula. There is an extraluminal collection containing fluid and air between the sigmoid colon and dome of the bladder, collection measuring 4.7 x 3.1 x 4.3 cm in size. There is no free air. Colon above this level is dilated with air-fluid levels, greatest dilation noted of the ascending colon just above the ileocecal valve, 10 cm in diameter. No other areas of colonic wall thickening or inflammation. Normal stomach. Small bowel normal in caliber thoracic entirety. No wall thickening. No inflammation. Normal appendix visualized. Vascular/Lymphatic: Aortic atherosclerosis. No aneurysm. No enlarged lymph nodes. Reproductive: Unremarkable. Other: Trace amount of ascites. Ill-defined opacity noted in the anterior upper abdominal peritoneal cavity, likely along the greater omentum, approximately  1.8 cm in greatest dimension. This is nonspecific. Peritoneal neoplastic disease is possible. Musculoskeletal: No fracture or acute finding.  No bone lesion. IMPRESSION: 1. Partial distal colonic obstruction. This is due to pathology along the mid to distal sigmoid colon. This segment of colon appears inflamed with irregular wall thickening and adjacent extraluminal collection, the latter finding, 4.7 cm in greatest dimension, consistent with a peridiverticular abscess. 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 However, sigmoid colon carcinoma remains in the differential diagnosis, with secondary inflammation. 2. Trace amount of ascites. Small focus of hypoattenuation in the upper anterior peritoneal cavity, nonspecific. This could reflect a focus of peritoneal carcinomatosis if the sigmoid colon pathology is due to neoplastic disease. 3. No other evidence of an acute abnormality within the abdomen or pelvis. 4. Hepatic steatosis. Electronically Signed   By: Amie Portland M.D.   On: 08/27/2020 13:38      IMPRESSION:   Partial colonic obstruction at level of sigmoid.  Fluid collection consistent with abscess.  Suspicion is of sigmoid diverticulitis but cannot rule out colonic neoplasm.  WBC significantly elevated at 22.9.  Day 1 Zosyn. Alcohol abuse and likely dependence.  Had been in rehab for close to a week before transfer to [facility] for GI issues. Hepatic steatosis per CT, LFTs normal. Nausea, vomiting, solid food dysphagia, chronic since diagnosis of COVID-19 in December 2021.  Set up for EGD 8/2 with Dr. Trisha Mangle at [facility] health care. Hyponatremia. Macrocytosis without anemia.    PLAN:     Dr. Yancey Flemings will provide opinion and decision regarding pursuing flexible sigmoidoscopy.   Jennye Moccasin  08/28/2020, 10:19 AM Phone [phone]  GI ATTENDING  History, laboratories, x-rays reviewed.  Patient seen and examined.  Agree with  comprehensive  consultation note as outlined above.  The patient presents with symptomatic sigmoid colon obstruction.  Most likely related to diverticular disease with associated abscess.  Certainly cannot rule out malignancy.  Though there may be a role for endoscopic assessment, this is not the time.  His acute obstruction should be managed as it would normally.  May need NG tube.  IV hydration.  Agree with antibiotics to treat what may be diverticular abscess with obstruction.  Is the abscess large enough for percutaneous drainage?  If the patient response to these measures, then lower risk colonic instrumentation might be performed, for the purposes of longer term management.  At this early point in his admission, colonic instrumentation may exacerbate the clinical situation.  If he does not respond to these measures (i.e. persistent obstruction) he will need surgery, regardless of the etiology.  We will continue to follow with you.  Thank you.  Wilhemina Bonito. Eda Keys., M.D. [facility] Division of Gastroenterology

## 2020-08-28 NOTE — Plan of Care (Signed)

## 2020-08-28 NOTE — Progress Notes (Addendum)
Progress Note     Subjective: CC: still bloated with 7/10 abdominal pain this am. Pain is worsened with movement. He did have a large nonbloody, nonpainful BM this am which was the first in about 7 days. No nausea or emesis. Ambulating. LOI Saturday  His sisters are bedside  Objective: Vital signs in last 24 hours: Temp:  [98.6 F (37 C)-99.4 F (37.4 C)] 99.2 F (37.3 C) (07/05 0610) Pulse Rate:  [87-108] 108 (07/05 0610) Resp:  [15-18] 17 (07/05 0610) BP: (129-149)/(69-84) 142/84 (07/05 0610) SpO2:  [95 %-98 %] 95 % (07/05 0610) Weight:  [74.8 kg] 74.8 kg (07/04 1020) Last BM Date: 08/26/20  Intake/Output from previous day: No intake/output data recorded. Intake/Output this shift: No intake/output data recorded.  PE: General: pleasant, WD, male who is laying in bed in NAD HEENT: head is normocephalic, atraumatic. Mouth is pink and moist Heart: Palpable radial and pedal pulses bilaterally Lungs: Respiratory effort nonlabored Abd: +BS. Moderate distension with diffuse mild TTP worst in RLQ. MS: all 4 extremities are symmetrical with no cyanosis, clubbing, or edema. Skin: warm and dry with no masses, lesions, or rashes Psych: A&Ox3 with an appropriate affect.    Lab Results:  Recent Labs    08/27/20 1125 08/28/20 0243  WBC 19.7* 22.9*  HGB 13.6 13.5  HCT 40.1 39.4  PLT 690* 697*   BMET Recent Labs    08/27/20 1125 08/28/20 0243  NA 132* 131*  K 4.2 4.5  CL 92* 95*  CO2 31 27  GLUCOSE 108* 97  BUN 9 9  CREATININE 0.69 0.78  CALCIUM 9.8 9.2   PT/INR No results for input(s): LABPROT, INR in the last 72 hours. CMP     Component Value Date/Time   NA 131 (L) 08/28/2020 0243   K 4.5 08/28/2020 0243   CL 95 (L) 08/28/2020 0243   CO2 27 08/28/2020 0243   GLUCOSE 97 08/28/2020 0243   BUN 9 08/28/2020 0243   CREATININE 0.78 08/28/2020 0243   CALCIUM 9.2 08/28/2020 0243   PROT 7.5 08/27/2020 1125   ALBUMIN 3.7 08/27/2020 1125   AST 26 08/27/2020 1125    ALT 39 08/27/2020 1125   ALKPHOS 78 08/27/2020 1125   BILITOT 0.7 08/27/2020 1125   GFRNONAA >60 08/28/2020 0243   Lipase     Component Value Date/Time   LIPASE 25 08/27/2020 1125       Studies/Results: CT Abdomen Pelvis W Contrast  Result Date: 08/27/2020 CLINICAL DATA:  Abdominal pain beginning yesterday. Five day history of constipation. Two episodes of vomiting yesterday. EXAM: CT ABDOMEN AND PELVIS WITH CONTRAST TECHNIQUE: Multidetector CT imaging of the abdomen and pelvis was performed using the standard protocol following bolus administration of intravenous contrast. CONTRAST:  67mL OMNIPAQUE IOHEXOL 300 MG/ML  SOLN COMPARISON:  None. FINDINGS: Lower chest: Lung bases essentially clear.  Heart normal in size. Hepatobiliary: Liver normal in size. Diffuse decreased attenuation of the liver consistent with fatty infiltration. Subcentimeter low-attenuation lesion in the anterior right lobe at the liver dome, too small to characterize, but consistent with a cyst. No other liver masses or lesions. Gallbladder mostly collapsed. No bile duct dilation. Pancreas: Unremarkable. No pancreatic ductal dilatation or surrounding inflammatory changes. Spleen: Normal in size without focal abnormality. Adrenals/Urinary Tract: Adrenal glands are unremarkable. Kidneys are normal, without renal calculi, focal lesion, or hydronephrosis. Bladder is unremarkable. Stomach/Bowel: There is irregular wall thickening and adjacent inflammation of the mid to distal sigmoid colon, where there are several small  diverticula. There is an extraluminal collection containing fluid and air between the sigmoid colon and dome of the bladder, collection measuring 4.7 x 3.1 x 4.3 cm in size. There is no free air. Colon above this level is dilated with air-fluid levels, greatest dilation noted of the ascending colon just above the ileocecal valve, 10 cm in diameter. No other areas of colonic wall thickening or inflammation. Normal  stomach. Small bowel normal in caliber thoracic entirety. No wall thickening. No inflammation. Normal appendix visualized. Vascular/Lymphatic: Aortic atherosclerosis. No aneurysm. No enlarged lymph nodes. Reproductive: Unremarkable. Other: Trace amount of ascites. Ill-defined opacity noted in the anterior upper abdominal peritoneal cavity, likely along the greater omentum, approximately 1.8 cm in greatest dimension. This is nonspecific. Peritoneal neoplastic disease is possible. Musculoskeletal: No fracture or acute finding.  No bone lesion. IMPRESSION: 1. Partial distal colonic obstruction. This is due to pathology along the mid to distal sigmoid colon. This segment of colon appears inflamed with irregular wall thickening and adjacent extraluminal collection, the latter finding, 4.7 cm in greatest dimension, consistent with a peridiverticular abscess. 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 However, sigmoid colon carcinoma remains in the differential diagnosis, with secondary inflammation. 2. Trace amount of ascites. Small focus of hypoattenuation in the upper anterior peritoneal cavity, nonspecific. This could reflect a focus of peritoneal carcinomatosis if the sigmoid colon pathology is due to neoplastic disease. 3. No other evidence of an acute abnormality within the abdomen or pelvis. 4. Hepatic steatosis. Electronically Signed   By: Amie Portland M.D.   On: 08/27/2020 13:38    Anti-infectives: Anti-infectives (From admission, onward)    Start     Dose/Rate Route Frequency Ordered Stop   08/28/20 2315  piperacillin-tazobactam (ZOSYN) IVPB 3.375 g        3.375 g 12.5 mL/hr over 240 Minutes Intravenous Every 8 hours 08/27/20 2308     08/27/20 1515  piperacillin-tazobactam (ZOSYN) IVPB 3.375 g        3.375 g 12.5 mL/hr over 240 Minutes Intravenous  Once 08/27/20 1507 08/27/20 1914        Assessment/Plan Colonic obstruction with inflammation and abscess. Unclear  etiology Diverticulitis vs cancer. Ileocecal valve appears incompetent.   - NGT for decompression if he develops worse pain or n/v. Currently stable - have consulted GI for possible flex sig and biopsy - this would help determine if there is reasonable chance for inflammation to resolve to get to formal colonoscopy. Also would be helpful in treatment planning in case of malignancy - continue IVF and abx  FEN: NPO, IVF ID: [email or web address] VTE: lovenox  Tobacco use - nicotine patch      LOS: 1 day    Eric Form, [facility] 08/28/2020, 9:48 AM Please see Amion for pager number during day hours 7:00am-4:30pm  ADDENDUM 09/05/20 for cosignature

## 2020-08-29 ENCOUNTER — Encounter (HOSPITAL_COMMUNITY): Admission: EM | Disposition: A | Discharge status: Home Health | Payer: Self-pay | Source: Home / Self Care

## 2020-08-29 ENCOUNTER — Inpatient Hospital Stay (HOSPITAL_COMMUNITY): Payer: BC Managed Care – PPO | Admitting: Certified Registered"

## 2020-08-29 ENCOUNTER — Inpatient Hospital Stay (HOSPITAL_COMMUNITY): Payer: BC Managed Care – PPO

## 2020-08-29 ENCOUNTER — Encounter (HOSPITAL_COMMUNITY): Payer: Self-pay

## 2020-08-29 DIAGNOSIS — K5792 Diverticulitis of intestine, part unspecified, without perforation or abscess without bleeding: Secondary | ICD-10-CM | POA: Diagnosis not present

## 2020-08-29 DIAGNOSIS — K56609 Unspecified intestinal obstruction, unspecified as to partial versus complete obstruction: Secondary | ICD-10-CM | POA: Diagnosis not present

## 2020-08-29 DIAGNOSIS — K56699 Other intestinal obstruction unspecified as to partial versus complete obstruction: Secondary | ICD-10-CM | POA: Diagnosis not present

## 2020-08-29 HISTORY — PX: COLOSTOMY: SHX63

## 2020-08-29 HISTORY — PX: LAPAROTOMY: SHX154

## 2020-08-29 HISTORY — PX: PARTIAL COLECTOMY: SHX5273

## 2020-08-29 LAB — BASIC METABOLIC PANEL
Anion gap: 12 (ref 5–15)
BUN: 15 mg/dL (ref 6–20)
CO2: 24 mmol/L (ref 22–32)
Calcium: 8.8 mg/dL — ABNORMAL LOW (ref 8.9–10.3)
Chloride: 97 mmol/L — ABNORMAL LOW (ref 98–111)
Creatinine, Ser: 1.13 mg/dL (ref 0.61–1.24)
GFR, Estimated: >60 mL/min (ref >60)
Glucose, Bld: 135 mg/dL — ABNORMAL HIGH (ref 70–99)
Potassium: 5.4 mmol/L — ABNORMAL HIGH (ref 3.5–5.1)
Sodium: 133 mmol/L — ABNORMAL LOW (ref 135–145)

## 2020-08-29 LAB — TYPE AND SCREEN
ABO/RH(D): O POS
Antibody Screen: NEGATIVE

## 2020-08-29 LAB — ABO/RH: ABO/RH(D): O POS

## 2020-08-29 SURGERY — LAPAROTOMY, EXPLORATORY
Anesthesia: General | Site: Abdomen

## 2020-08-29 MED ORDER — ENOXAPARIN SODIUM 40 MG/0.4ML IJ SOSY
40.0000 mg | PREFILLED_SYRINGE | INTRAMUSCULAR | Status: DC
  Administered 2020-08-30 – 2020-09-19 (×21): 40 mg via SUBCUTANEOUS
  Filled 2020-08-29 (×22): qty 0.4

## 2020-08-29 MED ORDER — PROMETHAZINE HCL 25 MG/ML IJ SOLN
6.2500 mg | Freq: Once | INTRAMUSCULAR | Status: AC
  Administered 2020-08-29: 6.25 mg via INTRAVENOUS

## 2020-08-29 MED ORDER — ROCURONIUM BROMIDE 10 MG/ML (PF) SYRINGE
PREFILLED_SYRINGE | INTRAVENOUS | Status: AC
  Filled 2020-08-29: qty 20

## 2020-08-29 MED ORDER — FENTANYL CITRATE (PF) 100 MCG/2ML IJ SOLN
INTRAMUSCULAR | Status: DC | PRN
  Administered 2020-08-29 (×2): 100 ug via INTRAVENOUS

## 2020-08-29 MED ORDER — FENTANYL CITRATE (PF) 250 MCG/5ML IJ SOLN
INTRAMUSCULAR | Status: AC
  Filled 2020-08-29: qty 5

## 2020-08-29 MED ORDER — ONDANSETRON HCL 4 MG/2ML IJ SOLN
4.0000 mg | Freq: Four times a day (QID) | INTRAMUSCULAR | Status: DC | PRN
Start: 2020-08-29 — End: 2020-09-19
  Administered 2020-09-01 – 2020-09-10 (×7): 4 mg via INTRAVENOUS
  Filled 2020-08-29 (×7): qty 2

## 2020-08-29 MED ORDER — DEXAMETHASONE SODIUM PHOSPHATE 10 MG/ML IJ SOLN
INTRAMUSCULAR | Status: DC | PRN
  Administered 2020-08-29: 5 mg via INTRAVENOUS

## 2020-08-29 MED ORDER — KETAMINE HCL 50 MG/5ML IJ SOSY
PREFILLED_SYRINGE | INTRAMUSCULAR | Status: AC
  Filled 2020-08-29: qty 5

## 2020-08-29 MED ORDER — ACETAMINOPHEN 500 MG PO TABS
1000.0000 mg | ORAL_TABLET | Freq: Once | ORAL | Status: DC
  Filled 2020-08-29 (×3): qty 2

## 2020-08-29 MED ORDER — SUCCINYLCHOLINE CHLORIDE 200 MG/10ML IV SOSY
PREFILLED_SYRINGE | INTRAVENOUS | Status: DC | PRN
  Administered 2020-08-29: 140 mg via INTRAVENOUS

## 2020-08-29 MED ORDER — ACETAMINOPHEN 10 MG/ML IV SOLN
1000.0000 mg | Freq: Four times a day (QID) | INTRAVENOUS | Status: DC
  Administered 2020-08-29 – 2020-08-30 (×3): 1000 mg via INTRAVENOUS
  Filled 2020-08-29 (×4): qty 100

## 2020-08-29 MED ORDER — FENTANYL CITRATE (PF) 100 MCG/2ML IJ SOLN
INTRAMUSCULAR | Status: AC
  Filled 2020-08-29: qty 2

## 2020-08-29 MED ORDER — FENTANYL CITRATE (PF) 100 MCG/2ML IJ SOLN
25.0000 ug | INTRAMUSCULAR | Status: DC | PRN
  Administered 2020-08-29 (×3): 50 ug via INTRAVENOUS

## 2020-08-29 MED ORDER — PROPOFOL 10 MG/ML IV BOLUS
INTRAVENOUS | Status: DC | PRN
  Administered 2020-08-29: 150 mg via INTRAVENOUS

## 2020-08-29 MED ORDER — KETAMINE HCL 10 MG/ML IJ SOLN
INTRAMUSCULAR | Status: DC | PRN
  Administered 2020-08-29 (×2): 25 mg via INTRAVENOUS

## 2020-08-29 MED ORDER — MIDAZOLAM HCL 2 MG/2ML IJ SOLN
INTRAMUSCULAR | Status: DC | PRN
  Administered 2020-08-29: 2 mg via INTRAVENOUS

## 2020-08-29 MED ORDER — ALBUMIN HUMAN 5 % IV SOLN: INTRAVENOUS | Status: DC | PRN

## 2020-08-29 MED ORDER — MIDAZOLAM HCL 2 MG/2ML IJ SOLN
INTRAMUSCULAR | Status: AC
  Filled 2020-08-29: qty 2

## 2020-08-29 MED ORDER — LIDOCAINE 2% (20 MG/ML) 5 ML SYRINGE
INTRAMUSCULAR | Status: AC
  Filled 2020-08-29: qty 5

## 2020-08-29 MED ORDER — ONDANSETRON HCL 4 MG/2ML IJ SOLN
INTRAMUSCULAR | Status: DC | PRN
  Administered 2020-08-29: 4 mg via INTRAVENOUS

## 2020-08-29 MED ORDER — DEXAMETHASONE SODIUM PHOSPHATE 10 MG/ML IJ SOLN
INTRAMUSCULAR | Status: AC
  Filled 2020-08-29: qty 1

## 2020-08-29 MED ORDER — ROCURONIUM BROMIDE 10 MG/ML (PF) SYRINGE
PREFILLED_SYRINGE | INTRAVENOUS | Status: DC | PRN
  Administered 2020-08-29: 30 mg via INTRAVENOUS
  Administered 2020-08-29 (×3): 10 mg via INTRAVENOUS

## 2020-08-29 MED ORDER — ONDANSETRON HCL 4 MG/2ML IJ SOLN
INTRAMUSCULAR | Status: AC
  Filled 2020-08-29: qty 2

## 2020-08-29 MED ORDER — SUGAMMADEX SODIUM 200 MG/2ML IV SOLN
INTRAVENOUS | Status: DC | PRN
  Administered 2020-08-29: 200 mg via INTRAVENOUS

## 2020-08-29 MED ORDER — SUCCINYLCHOLINE CHLORIDE 200 MG/10ML IV SOSY
PREFILLED_SYRINGE | INTRAVENOUS | Status: AC
  Filled 2020-08-29: qty 10

## 2020-08-29 MED ORDER — PROPOFOL 10 MG/ML IV BOLUS
INTRAVENOUS | Status: AC
  Filled 2020-08-29: qty 20

## 2020-08-29 MED ORDER — CHLORHEXIDINE GLUCONATE CLOTH 2 % EX PADS
6.0000 | MEDICATED_PAD | Freq: Every day | CUTANEOUS | Status: DC
  Administered 2020-08-31 – 2020-09-02 (×2): 6 via TOPICAL

## 2020-08-29 MED ORDER — ACETAMINOPHEN 10 MG/ML IV SOLN
INTRAVENOUS | Status: AC
  Filled 2020-08-29: qty 100

## 2020-08-29 MED ORDER — HYDROMORPHONE HCL 1 MG/ML IJ SOLN
0.5000 mg | INTRAMUSCULAR | Status: DC | PRN
  Administered 2020-08-29 – 2020-08-31 (×17): 1 mg via INTRAVENOUS
  Filled 2020-08-29 (×18): qty 1

## 2020-08-29 MED ORDER — PHENYLEPHRINE 40 MCG/ML (10ML) SYRINGE FOR IV PUSH (FOR BLOOD PRESSURE SUPPORT)
PREFILLED_SYRINGE | INTRAVENOUS | Status: DC | PRN
  Administered 2020-08-29: 200 ug via INTRAVENOUS

## 2020-08-29 MED ORDER — METHOCARBAMOL 1000 MG/10ML IJ SOLN
1000.0000 mg | Freq: Four times a day (QID) | INTRAVENOUS | Status: DC | PRN
  Administered 2020-08-30: 1000 mg via INTRAVENOUS
  Filled 2020-08-29 (×3): qty 10

## 2020-08-29 MED ORDER — LACTATED RINGERS IV SOLN: INTRAVENOUS | Status: DC | PRN

## 2020-08-29 MED ORDER — 0.9 % SODIUM CHLORIDE (POUR BTL) OPTIME
TOPICAL | Status: DC | PRN
  Administered 2020-08-29 (×3): 1000 mL

## 2020-08-29 MED ORDER — ACETAMINOPHEN 10 MG/ML IV SOLN
INTRAVENOUS | Status: DC | PRN
  Administered 2020-08-29: 1000 mg via INTRAVENOUS

## 2020-08-29 MED ORDER — PROMETHAZINE HCL 25 MG/ML IJ SOLN
INTRAMUSCULAR | Status: AC
  Filled 2020-08-29: qty 1

## 2020-08-29 MED ORDER — LIDOCAINE 2% (20 MG/ML) 5 ML SYRINGE
INTRAMUSCULAR | Status: DC | PRN
  Administered 2020-08-29: 60 mg via INTRAVENOUS

## 2020-08-29 SURGICAL SUPPLY — 56 items
BAG COUNTER SPONGE SURGICOUNT (BAG) ×2 IMPLANT
BIOPATCH RED 1 DISK 7.0 (GAUZE/BANDAGES/DRESSINGS) ×2 IMPLANT
BLADE CLIPPER SURG (BLADE) ×2 IMPLANT
BNDG GAUZE ELAST 4 BULKY (GAUZE/BANDAGES/DRESSINGS) ×2 IMPLANT
CANISTER SUCT 3000ML PPV (MISCELLANEOUS) ×2 IMPLANT
CHLORAPREP W/TINT 26 (MISCELLANEOUS) ×2 IMPLANT
COVER SURGICAL LIGHT HANDLE (MISCELLANEOUS) ×4 IMPLANT
DRAIN CHANNEL 19F RND (DRAIN) ×2 IMPLANT
DRAPE LAPAROSCOPIC ABDOMINAL (DRAPES) ×2 IMPLANT
DRAPE WARM FLUID 44X44 (DRAPES) ×2 IMPLANT
DRSG OPSITE POSTOP 4X10 (GAUZE/BANDAGES/DRESSINGS) ×2 IMPLANT
DRSG OPSITE POSTOP 4X8 (GAUZE/BANDAGES/DRESSINGS) IMPLANT
DRSG PAD ABDOMINAL 8X10 ST (GAUZE/BANDAGES/DRESSINGS) ×2 IMPLANT
DRSG TEGADERM 4X4.75 (GAUZE/BANDAGES/DRESSINGS) ×2 IMPLANT
ELECT BLADE 6.5 EXT (BLADE) IMPLANT
ELECT CAUTERY BLADE 6.4 (BLADE) ×4 IMPLANT
ELECT REM PT RETURN 9FT ADLT (ELECTROSURGICAL) ×2
ELECTRODE REM PT RTRN 9FT ADLT (ELECTROSURGICAL) ×1 IMPLANT
EVACUATOR SILICONE 100CC (DRAIN) ×2 IMPLANT
GLOVE SURG ENC MOIS LTX SZ6 (GLOVE) ×2 IMPLANT
GLOVE SURG UNDER LTX SZ6.5 (GLOVE) ×2 IMPLANT
GOWN STRL REUS W/ TWL LRG LVL3 (GOWN DISPOSABLE) ×6 IMPLANT
GOWN STRL REUS W/TWL LRG LVL3 (GOWN DISPOSABLE) ×6
HANDLE SUCTION POOLE (INSTRUMENTS) ×2 IMPLANT
KIT BASIN OR (CUSTOM PROCEDURE TRAY) ×2 IMPLANT
KIT OSTOMY DRAINABLE 2.75 STR (WOUND CARE) ×2 IMPLANT
KIT SIGMOIDOSCOPE (SET/KITS/TRAYS/PACK) IMPLANT
KIT TURNOVER KIT B (KITS) ×2 IMPLANT
LIGASURE IMPACT 36 18CM CVD LR (INSTRUMENTS) ×2 IMPLANT
NS IRRIG 1000ML POUR BTL (IV SOLUTION) ×4 IMPLANT
PACK COLON (CUSTOM PROCEDURE TRAY) ×2 IMPLANT
PAD ARMBOARD 7.5X6 YLW CONV (MISCELLANEOUS) ×2 IMPLANT
PENCIL BUTTON HOLSTER BLD 10FT (ELECTRODE) ×2 IMPLANT
PENCIL SMOKE EVACUATOR (MISCELLANEOUS) ×2 IMPLANT
RETRACTOR WND ALEXIS 25 LRG (MISCELLANEOUS) ×1 IMPLANT
RTRCTR WOUND ALEXIS 25CM LRG (MISCELLANEOUS) ×2
SPECIMEN JAR LARGE (MISCELLANEOUS) IMPLANT
SPONGE T-LAP 18X18 ~~LOC~~+RFID (SPONGE) ×6 IMPLANT
STAPLER CUT RELOAD GREEN (STAPLE) ×2 IMPLANT
STAPLER CVD CUT GN 40 RELOAD (ENDOMECHANICALS) ×2 IMPLANT
STAPLER VISISTAT 35W (STAPLE) ×2 IMPLANT
SUCTION POOLE HANDLE (INSTRUMENTS) ×4
SURGILUBE 2OZ TUBE FLIPTOP (MISCELLANEOUS) IMPLANT
SUT ETHILON 2 0 FS 18 (SUTURE) ×2 IMPLANT
SUT PDS AB 1 TP1 96 (SUTURE) ×4 IMPLANT
SUT PROLENE 2 0 CT2 30 (SUTURE) ×2 IMPLANT
SUT PROLENE 2 0 KS (SUTURE) IMPLANT
SUT SILK 2 0 SH CR/8 (SUTURE) ×2 IMPLANT
SUT SILK 2 0 TIES 10X30 (SUTURE) ×2 IMPLANT
SUT SILK 3 0 SH CR/8 (SUTURE) ×2 IMPLANT
SUT SILK 3 0 TIES 10X30 (SUTURE) ×2 IMPLANT
SUT VIC AB 3-0 SH 18 (SUTURE) ×2 IMPLANT
TAPE PAPER 3X10 WHT MICROPORE (GAUZE/BANDAGES/DRESSINGS) ×2 IMPLANT
TOWEL GREEN STERILE (TOWEL DISPOSABLE) ×2 IMPLANT
TRAY FOLEY MTR SLVR 16FR STAT (SET/KITS/TRAYS/PACK) ×2 IMPLANT
TUBE CONNECTING 12X1/4 (SUCTIONS) ×2 IMPLANT

## 2020-08-29 NOTE — Op Note (Signed)
Operative Note  Ryan Velasquez  [id number]  [id number]  08/29/2020   Surgeon: Phylliss Blakes MD FACS   Assistant: Leary Roca PA-C   Procedure performed: Exploratory laparotomy, sigmoid colectomy with end colostomy   Preop diagnosis: Large bowel obstruction Post-op diagnosis/intraop findings: Same secondary to mass/abscess surrounding the sigmoid colon   Specimens: Sigmoid colon Retained items: 19 French round Blake drain in the pelvis EBL: 30cc Complications: none   Description of procedure: After obtaining informed consent the patient was taken to the operating room and placed supine on operating room table where general endotracheal anesthesia was initiated, preoperative antibiotics were administered, SCDs applied, and a formal timeout was performed.  Foley catheter was inserted.  The abdomen was prepped and draped in usual sterile fashion.  A vertical midline laparotomy was created and peritoneal cavity entered.  An Alexis wound protector was placed.  Small amount of serous ascites was present in the small and large bowel were noted to be diffusely extremely dilated.  With some difficulty this was able to be retracted in the sigmoid colon identified, which was noted to be extremely inflamed with a firm mass consistency and inflammatory adhesions to an abscess cavity on the posterior wall of the bladder.  These adhesions were bluntly divided and the lateral attachments of the sigmoid colon were taken down with cautery until the mass to be mobilized out of the pelvis.  Healthy rectum was identified and divided using a contour green stapler distal to the disease process.  A 2-0 Prolene suture was placed on the rectal stump for future identification.  The harmonic scalpel was then used to divide the mesentery staying close to the colon until we reached healthy appearing descending colon proximal to the disease process.  Before dividing it, a pursestring suture was placed on the specimen side and a  colotomy made following which the pool sucker was inserted and a large amount of gas and stool was decompressed.  The suture was then tied down.  There was minimal spillage of stool.  Another load of the contour stapler was used to divide the colon proximal to the disease process and the specimen was handed off for pathology.  Additional mesentery was divided and lateral attachments taken down in order to afford and of reach for the colostomy to reach the abdominal wall.  The small bowel was run from the ligament of Treitz to the ileocecal valve and no injuries identified.  The cecum and right colon were inspected and confirmed to be intact and viable, no perforation was identified.  The abdomen was irrigated with warm sterile saline.  A 19 French round Blake drain was placed in the pelvis and secured to the skin on the right lower quadrant with a 2-0 nylon.  A colostomy site was created in the left upper quadrant and then divided end of the colon brought out through this.  This was secured with a Babcock while the abdomen was closed with looped #1 PDS starting on either end and tying centrally.  A wet-to-dry dressing was placed in the wound.  The colostomy was then matured with interrupted 3-0 Vicryls.  Upon completion the colostomy appears viable, is flush with the skin, and is palpably patent through the fascia.  An ostomy appliance was placed. The patient was then awakened, extubated and taken to PACU in stable condition.    All counts were correct at the completion of the case.

## 2020-08-29 NOTE — Progress Notes (Signed)
Progress Note  Day of Surgery  Subjective: CC: Ongoing distention. No further bowel function.   Objective: Vital signs in last 24 hours: Temp:  [98.5 F (36.9 C)-99.2 F (37.3 C)] 98.5 F (36.9 C) (07/06 0447) Pulse Rate:  [97-120] 97 (07/06 0447) Resp:  [16-18] 16 (07/06 0447) BP: (125-142)/(70-91) 125/70 (07/06 0447) SpO2:  [94 %-97 %] 97 % (07/06 0447) Last BM Date: 08/28/20  Intake/Output from previous day: 07/05 0701 - 07/06 0700 In: 1366.8 [I.V.:1266.8; IV Piggyback:100] Out: 135 [Urine:125; Emesis/NG output:10] Intake/Output this shift: No intake/output data recorded.  PE: General: pleasant, WD, male who is laying in bed in NAD HEENT: head is normocephalic, atraumatic. Mouth is pink and moist Heart: Palpable radial and pedal pulses bilaterally Lungs: Respiratory effort nonlabored Abd: Distended, mildly TTP RLQ MS: all 4 extremities are symmetrical with no cyanosis, clubbing, or edema. Skin: warm and dry with no masses, lesions, or rashes Psych: A&Ox3 with an appropriate affect.    Lab Results:  Recent Labs    08/27/20 1125 08/28/20 0243  WBC 19.7* 22.9*  HGB 13.6 13.5  HCT 40.1 39.4  PLT 690* 697*    BMET Recent Labs    08/27/20 1125 08/28/20 0243  NA 132* 131*  K 4.2 4.5  CL 92* 95*  CO2 31 27  GLUCOSE 108* 97  BUN 9 9  CREATININE 0.69 0.78  CALCIUM 9.8 9.2    PT/INR No results for input(s): LABPROT, INR in the last 72 hours. CMP     Component Value Date/Time   NA 131 (L) 08/28/2020 0243   K 4.5 08/28/2020 0243   CL 95 (L) 08/28/2020 0243   CO2 27 08/28/2020 0243   GLUCOSE 97 08/28/2020 0243   BUN 9 08/28/2020 0243   CREATININE 0.78 08/28/2020 0243   CALCIUM 9.2 08/28/2020 0243   PROT 7.5 08/27/2020 1125   ALBUMIN 3.7 08/27/2020 1125   AST 26 08/27/2020 1125   ALT 39 08/27/2020 1125   ALKPHOS 78 08/27/2020 1125   BILITOT 0.7 08/27/2020 1125   GFRNONAA >60 08/28/2020 0243   Lipase     Component Value Date/Time   LIPASE  25 08/27/2020 1125       Studies/Results: DG Abd 1 View  Result Date: 08/28/2020 CLINICAL DATA:  NG tube placement EXAM: ABDOMEN - 1 VIEW COMPARISON:  CT 08/27/2020 FINDINGS: There is a nasogastric tube with side port overlying the distal esophagus and tip overlying the stomach. There is diffuse small and large bowel dilation consistent with known large bowel obstruction. No acute osseous abnormality. IMPRESSION: Nasogastric tube side port overlies the distal esophagus, recommend advancement by 6.5 cm. Persistent bowel obstruction. Electronically Signed   By: Caprice Renshaw   On: 08/28/2020 17:31   CT Abdomen Pelvis W Contrast  Result Date: 08/27/2020 CLINICAL DATA:  Abdominal pain beginning yesterday. Five day history of constipation. Two episodes of vomiting yesterday. EXAM: CT ABDOMEN AND PELVIS WITH CONTRAST TECHNIQUE: Multidetector CT imaging of the abdomen and pelvis was performed using the standard protocol following bolus administration of intravenous contrast. CONTRAST:  27mL OMNIPAQUE IOHEXOL 300 MG/ML  SOLN COMPARISON:  None. FINDINGS: Lower chest: Lung bases essentially clear.  Heart normal in size. Hepatobiliary: Liver normal in size. Diffuse decreased attenuation of the liver consistent with fatty infiltration. Subcentimeter low-attenuation lesion in the anterior right lobe at the liver dome, too small to characterize, but consistent with a cyst. No other liver masses or lesions. Gallbladder mostly collapsed. No bile duct dilation. Pancreas: Unremarkable.  No pancreatic ductal dilatation or surrounding inflammatory changes. Spleen: Normal in size without focal abnormality. Adrenals/Urinary Tract: Adrenal glands are unremarkable. Kidneys are normal, without renal calculi, focal lesion, or hydronephrosis. Bladder is unremarkable. Stomach/Bowel: There is irregular wall thickening and adjacent inflammation of the mid to distal sigmoid colon, where there are several small diverticula. There is an  extraluminal collection containing fluid and air between the sigmoid colon and dome of the bladder, collection measuring 4.7 x 3.1 x 4.3 cm in size. There is no free air. Colon above this level is dilated with air-fluid levels, greatest dilation noted of the ascending colon just above the ileocecal valve, 10 cm in diameter. No other areas of colonic wall thickening or inflammation. Normal stomach. Small bowel normal in caliber thoracic entirety. No wall thickening. No inflammation. Normal appendix visualized. Vascular/Lymphatic: Aortic atherosclerosis. No aneurysm. No enlarged lymph nodes. Reproductive: Unremarkable. Other: Trace amount of ascites. Ill-defined opacity noted in the anterior upper abdominal peritoneal cavity, likely along the greater omentum, approximately 1.8 cm in greatest dimension. This is nonspecific. Peritoneal neoplastic disease is possible. Musculoskeletal: No fracture or acute finding.  No bone lesion. IMPRESSION: 1. Partial distal colonic obstruction. This is due to pathology along the mid to distal sigmoid colon. This segment of colon appears inflamed with irregular wall thickening and adjacent extraluminal collection, the latter finding, 4.7 cm in greatest dimension, consistent with a peridiverticular abscess. 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 However, sigmoid colon carcinoma remains in the differential diagnosis, with secondary inflammation. 2. Trace amount of ascites. Small focus of hypoattenuation in the upper anterior peritoneal cavity, nonspecific. This could reflect a focus of peritoneal carcinomatosis if the sigmoid colon pathology is due to neoplastic disease. 3. No other evidence of an acute abnormality within the abdomen or pelvis. 4. Hepatic steatosis. Electronically Signed   By: Amie Portland M.D.   On: 08/27/2020 13:38   DG Abd Portable 1V  Result Date: 08/28/2020 CLINICAL DATA:  NG tube placement EXAM: PORTABLE ABDOMEN - 1 VIEW  COMPARISON:  None. FINDINGS: Enteric tube terminates in the gastric fundus. Distended loops of bowel, including transverse colon, in this patient with known distal colonic obstruction. IMPRESSION: Enteric tube terminates in the gastric fundus. Electronically Signed   By: Charline Bills M.D.   On: 08/28/2020 19:24    Anti-infectives: Anti-infectives (From admission, onward)    Start     Dose/Rate Route Frequency Ordered Stop   08/28/20 2315  piperacillin-tazobactam (ZOSYN) IVPB 3.375 g        3.375 g 12.5 mL/hr over 240 Minutes Intravenous Every 8 hours 08/27/20 2308     08/27/20 1515  piperacillin-tazobactam (ZOSYN) IVPB 3.375 g        3.375 g 12.5 mL/hr over 240 Minutes Intravenous  Once 08/27/20 1507 08/28/20 1901        Assessment/Plan Colonic (sigmoid)obstruction with inflammation and abscess.  - No improvement with bowel rest/NG/abx over last 24h  - I recommend proceeding with ex lap, hartmans tthis morning. We discussed the procedure, relevant anatomy, and risks including bleeding, infection, pain, scarring, injury to intraabdominal or retroperitoneal structures, ileus, wound healing problems/hernia, need for further procedures, cardiovascular/ pulmonary/ thromboembolic complications. Questions welcomed and answered to his statisfaction. Will proceed to OR this morning. - continue IVF and abx  FEN: NPO, IVF ID: [email or web address] VTE: lovenox  Tobacco use - nicotine patch      LOS: 2 days    Berna Bue, MD [facility] 08/29/2020, 7:32 AM  Please see Amion for pager number during day hours 7:00am-4:30pm

## 2020-08-29 NOTE — Progress Notes (Signed)
Patient arrived back to unit alert and oriented x4. He has a new colostomy with little blood drainage in bag. Midline incision. Dressing clean dry and intact. Vitals listed below.   08/29/20 1211  Vitals  Temp 98.4 F (36.9 C)  BP (!) 145/88  BP Location Right Arm  BP Method Automatic  Patient Position (if appropriate) Lying  Pulse Rate (!) 101  Pulse Rate Source Dinamap  Resp 19  Level of Consciousness  Level of Consciousness Alert  MEWS COLOR  MEWS Score Color Green  Oxygen Therapy  SpO2 95 %  O2 Device Room Air  MEWS Score  MEWS Temp 0  MEWS Systolic 0  MEWS Pulse 1  MEWS RR 0  MEWS LOC 0  MEWS Score 1

## 2020-08-29 NOTE — Anesthesia Preprocedure Evaluation (Addendum)
Anesthesia Evaluation  Patient identified by MRN, date of birth, ID band Patient awake    Reviewed: Allergy & Precautions, NPO status , Patient's Chart, lab work & pertinent test results  Airway Mallampati: I  TM Distance: >3 FB Neck ROM: Full    Dental   Upper and lower bridge:   Pulmonary neg pulmonary ROS, Current Smoker and Patient abstained from smoking.,    Pulmonary exam normal breath sounds clear to auscultation       Cardiovascular hypertension, Normal cardiovascular exam Rhythm:Regular Rate:Normal     Neuro/Psych negative neurological ROS  negative psych ROS   GI/Hepatic GERD  Medicated and Controlled,(+)     substance abuse  alcohol use,   Endo/Other  negative endocrine ROS  Renal/GU negative Renal ROS  negative genitourinary   Musculoskeletal negative musculoskeletal ROS (+)   Abdominal   Peds  Hematology negative hematology ROS (+)   Anesthesia Other Findings Colonic obstruction, diverticulitis vs CA  Reproductive/Obstetrics                            Anesthesia Physical Anesthesia Plan  ASA: 2  Anesthesia Plan: General   Post-op Pain Management:    Induction: Intravenous  PONV Risk Score and Plan: 1 and Midazolam, Dexamethasone and Ondansetron  Airway Management Planned: Oral ETT  Additional Equipment:   Intra-op Plan:   Post-operative Plan: Extubation in OR  Informed Consent: I have reviewed the patients History and Physical, chart, labs and discussed the procedure including the risks, benefits and alternatives for the proposed anesthesia with the patient or authorized representative who has indicated his/her understanding and acceptance.     Dental advisory given  Plan Discussed with: CRNA  Anesthesia Plan Comments:         Anesthesia Quick Evaluation

## 2020-08-29 NOTE — Anesthesia Procedure Notes (Signed)
Procedure Name: Intubation Date/Time: 08/29/2020 8:38 AM Performed by: Moshe Salisbury, CRNA Pre-anesthesia Checklist: Patient identified, Emergency Drugs available, Suction available and Patient being monitored Patient Re-evaluated:Patient Re-evaluated prior to induction Oxygen Delivery Method: Circle System Utilized Preoxygenation: Pre-oxygenation with 100% oxygen Induction Type: IV induction Ventilation: Mask ventilation without difficulty Laryngoscope Size: Mac and 4 Grade View: Grade II Tube type: Oral Tube size: 7.5 mm Number of attempts: 1 Airway Equipment and Method: Stylet Placement Confirmation: ETT inserted through vocal cords under direct vision, positive ETCO2 and breath sounds checked- equal and bilateral Secured at: 22 cm Tube secured with: Tape Dental Injury: Teeth and Oropharynx as per pre-operative assessment

## 2020-08-29 NOTE — Consult Note (Signed)
WOC consult requested for new colostomy. Pt is still in the perioperative setting today and our team will see him tomorrow to perform the first post-op pouch change and teaching session. Cammie Mcgee MSN, RN, CWOCN, [place], CNS [phone]

## 2020-08-29 NOTE — Plan of Care (Signed)

## 2020-08-29 NOTE — Progress Notes (Addendum)
S/p ex lap, sigmoid colectomy, end colostomy this AM.  Note operative findings of sigmoid mass, inflammatory adhesions, abscess.  Await surgical pathology.  GI available prn.  Jennye Moccasin PA-C  GI ATTENDING  Interval events and operative report reviewed.  Await pathology.  Will need colonoscopy at some point down the road after complete operative recovery.  GI available as needed in the interim.  Wilhemina Bonito. Eda Keys., M.D. [facility] Division of Gastroenterology

## 2020-08-29 NOTE — Transfer of Care (Signed)
Immediate Anesthesia Transfer of Care Note  Patient: Ryan Velasquez  Procedure(s) Performed: EXPLORATORY LAPAROTOMY (Abdomen) PARTIAL SIGMOID COLECTOMY (Abdomen) COLOSTOMY (Abdomen)  Patient Location: PACU  Anesthesia Type:General  Level of Consciousness: awake and patient cooperative  Airway & Oxygen Therapy: Patient Spontanous Breathing and Patient connected to nasal cannula oxygen  Post-op Assessment: Report given to RN, Post -op Vital signs reviewed and stable and Patient moving all extremities  Post vital signs: Reviewed and stable  Last Vitals:  Vitals Value Taken Time  BP 163/83 08/29/20 1043  Temp    Pulse 101 08/29/20 1044  Resp 26 08/29/20 1044  SpO2 99 % 08/29/20 1044  Vitals shown include unvalidated device data.  Last Pain:  Vitals:   08/29/20 0451  TempSrc:   PainSc: 4       Patients Stated Pain Goal: 2 (08/28/20 2300)  Complications: No notable events documented.

## 2020-08-29 NOTE — Anesthesia Postprocedure Evaluation (Signed)
Anesthesia Post Note  Patient: [name]  Procedure(s) Performed: EXPLORATORY LAPAROTOMY (Abdomen) PARTIAL SIGMOID COLECTOMY (Abdomen) COLOSTOMY (Abdomen)     Patient location during evaluation: PACU Anesthesia Type: General Level of consciousness: awake and alert Pain management: pain level controlled Vital Signs Assessment: post-procedure vital signs reviewed and stable Respiratory status: spontaneous breathing, nonlabored ventilation, respiratory function stable and patient connected to nasal cannula oxygen Cardiovascular status: blood pressure returned to baseline and stable Postop Assessment: no apparent nausea or vomiting Anesthetic complications: no   No notable events documented.  Last Vitals:  Vitals:   08/29/20 1130 08/29/20 1145  BP: (!) 154/90 (!) 157/90  Pulse: (!) 109 (!) 117  Resp: 13 (!) 22  Temp:  37 C  SpO2: 93% 93%    Last Pain:  Vitals:   08/29/20 1145  TempSrc:   PainSc: 6                  Kayven Aldaco L Laura Radilla

## 2020-08-29 NOTE — Progress Notes (Signed)
   08/28/20 2041 08/28/20 2143 08/28/20 2147  Vitals  Temp 99 F (37.2 C)  --   --   Temp Source Oral  --   --   BP 128/85  --   --   MAP (mmHg) 99  --   --   BP Location Right Arm  --   --   BP Method Automatic  --   --   Patient Position (if appropriate) Lying  --   --   Pulse Rate (!) 120 (!) 111  --   Pulse Rate Source Dinamap Left;Radial  --   Resp 17  --   --   MEWS COLOR  MEWS Score Color Yellow Yellow  --   Oxygen Therapy  SpO2 97 %  --   --   O2 Device Room Air  --   --   Pain Assessment  Pain Scale 0-10 0-10  --   Pain Score 6 6  --   Pain Type Acute pain Acute pain  --   Pain Location Abdomen Abdomen  --   Pain Orientation Right;Lower Right;Lower  --   Pain Descriptors / Indicators Aching Aching  --   Pain Frequency Constant Constant  --   Pain Onset On-going On-going  --   Patients Stated Pain Goal 2 2  --   Pain Intervention(s) Medication (See eMAR) Medication (See eMAR)  --   POSS Scale (Pasero Opioid Sedation Scale)  POSS *See Group Information*  --  1-Acceptable,Awake and alert  --   MEWS Score  MEWS Temp 0 0  --   MEWS Systolic 0 0  --   MEWS Pulse 2 2  --   MEWS RR 0 0  --   MEWS LOC 0 0  --   MEWS Score 2 2  --   Provider Notification  Provider Name/Title  --   --  Dr. Kris Mouton  Date Provider Notified  --   --  08/28/20  Time Provider Notified  --   --  2147  Notification Type  --   --  Page  Notification Reason  --   --  Change in status (HR in 110s-120s, no chest pain, patient states uisually 70s-80s)  Note  Observations  --   --  Provider notified of yellow mews status. No response from provider.

## 2020-08-30 ENCOUNTER — Inpatient Hospital Stay (HOSPITAL_COMMUNITY): Payer: BC Managed Care – PPO

## 2020-08-30 ENCOUNTER — Encounter (HOSPITAL_COMMUNITY): Payer: Self-pay | Admitting: Surgery

## 2020-08-30 LAB — BASIC METABOLIC PANEL
Anion gap: 8 (ref 5–15)
BUN: 11 mg/dL (ref 6–20)
CO2: 31 mmol/L (ref 22–32)
Calcium: 8.9 mg/dL (ref 8.9–10.3)
Chloride: 97 mmol/L — ABNORMAL LOW (ref 98–111)
Creatinine, Ser: 0.93 mg/dL (ref 0.61–1.24)
GFR, Estimated: >60 mL/min (ref >60)
Glucose, Bld: 93 mg/dL (ref 70–99)
Potassium: 3.8 mmol/L (ref 3.5–5.1)
Sodium: 136 mmol/L (ref 135–145)

## 2020-08-30 LAB — CBC
HCT: 36.5 % — ABNORMAL LOW (ref 39.0–52.0)
Hemoglobin: 12.2 g/dL — ABNORMAL LOW (ref 13.0–17.0)
MCH: 35.4 pg — ABNORMAL HIGH (ref 26.0–34.0)
MCHC: 33.4 g/dL (ref 30.0–36.0)
MCV: 105.8 fL — ABNORMAL HIGH (ref 80.0–100.0)
Platelets: 611 10*3/uL — ABNORMAL HIGH (ref 150–400)
RBC: 3.45 MIL/uL — ABNORMAL LOW (ref 4.22–5.81)
RDW: 14.5 % (ref 11.5–15.5)
WBC: 22.5 10*3/uL — ABNORMAL HIGH (ref 4.0–10.5)
nRBC: 0 % (ref 0.0–0.2)

## 2020-08-30 LAB — SURGICAL PATHOLOGY

## 2020-08-30 MED ORDER — KETOROLAC TROMETHAMINE 15 MG/ML IJ SOLN
15.0000 mg | Freq: Four times a day (QID) | INTRAMUSCULAR | Status: AC | PRN
  Administered 2020-08-30 – 2020-08-31 (×4): 15 mg via INTRAVENOUS
  Filled 2020-08-30 (×4): qty 1

## 2020-08-30 MED ORDER — METHOCARBAMOL 1000 MG/10ML IJ SOLN
1000.0000 mg | Freq: Four times a day (QID) | INTRAVENOUS | Status: DC
  Administered 2020-08-30 – 2020-09-15 (×62): 1000 mg via INTRAVENOUS
  Filled 2020-08-30 (×10): qty 10
  Filled 2020-08-30: qty 1000
  Filled 2020-08-30 (×14): qty 10
  Filled 2020-08-30: qty 1000
  Filled 2020-08-30 (×4): qty 10
  Filled 2020-08-30 (×2): qty 1000
  Filled 2020-08-30 (×11): qty 10
  Filled 2020-08-30 (×2): qty 1000
  Filled 2020-08-30 (×2): qty 10
  Filled 2020-08-30: qty 1000
  Filled 2020-08-30 (×15): qty 10
  Filled 2020-08-30: qty 1000
  Filled 2020-08-30 (×6): qty 10
  Filled 2020-08-30: qty 1000

## 2020-08-30 NOTE — Progress Notes (Signed)
JP drain 20 mL serosanguinous.  Moist to dry dressing change

## 2020-08-30 NOTE — Progress Notes (Signed)
PT Cancellation Note  Patient Details Name: Ryan Velasquez MRN: [id number] DOB: 12/02/1970   Cancelled Treatment:    Reason Eval/Treat Not Completed: Other (comment).  Pt has been walking the halls, declines to have PT session.  He feels he is getting around fine, and encouraged him to ask nursing if he changes his mind.   Ivar Drape 08/30/2020, 12:19 PM  Samul Dada, PT MS Acute Rehab Dept. Number: [facility] [id number] and [facility] [phone]

## 2020-08-30 NOTE — Consult Note (Addendum)
WOC Nurse ostomy consult note Surgical team is following for assessment and plan of care for abd wound. Pt had colostomy surgery performed yesterday. Stoma type/location: Stoma is red and viable, flush with skin level with a slight valley located around site, 1 3/4 inches stoma Peristomal assessment: intact Output: small amt liquid brown stool Ostomy pouching: Current 2 piece flat pouch is leaking behind the barrier.  Applied barrier ring [place] # [phone]) and one piece flexible convex pouch [place] # [phone]) to attempt to maintain a seal. Education provided: Pt and girlfriend participated in teaching session and were able to open and close pouch to empty.  Discussed pouching routines and ordering supplies. Educational materials left at the bedside and ordered 5 sets of barrier rings and convex pouches.  Pt asked appropriate questions and watched the pouch change using a hand held mirror. Enrolled patient in [facility] DC program: Yes  Cammie Mcgee MSN, RN, [place], [place], [place] [phone]

## 2020-08-30 NOTE — Progress Notes (Signed)
Progress Note  1 Day Post-Op  Subjective: CC: having ongoing abdominal pain and using IV dilaudid about every 2 hours. Has not ambulated since surgery. Has had stool and gas output in colostomy. No nausea or emesis.   Objective: Vital signs in last 24 hours: Temp:  [97.8 F (36.6 C)-98.6 F (37 C)] 98 F (36.7 C) (07/07 0317) Pulse Rate:  [88-117] 88 (07/07 0317) Resp:  [13-26] 17 (07/07 0317) BP: (129-163)/(83-103) 148/88 (07/07 0317) SpO2:  [92 %-98 %] 97 % (07/07 0317) Last BM Date: 08/26/20  Intake/Output from previous day: 07/06 0701 - 07/07 0700 In: 1986.1 [I.V.:1236; IV Piggyback:750.1] Out: 1395 [Urine:340; Emesis/NG output:850; Drains:105; Blood:100] Intake/Output this shift: No intake/output data recorded.  PE: General: pleasant, WD, male who is laying in bed in NAD HEENT: head is normocephalic, atraumatic. Mouth is pink and moist Heart: regular, rate, and rhythm.  Palpable radial pulses bilaterally Lungs:  Respiratory effort nonlabored Abd: soft, +BS, moderate distension which is improved from preop. Appropriate TTP. Colostomy with liquid brown stool. NG cannister with bilious output GU: clear yellow urine in foley catheter MS: all 4 extremities are symmetrical with no cyanosis, clubbing, or edema. Skin: warm and dry with no masses, lesions, or rashes Psych: A&Ox3 with an appropriate affect.     Lab Results:  Recent Labs    08/28/20 0243 08/30/20 0047  WBC 22.9* 22.5*  HGB 13.5 12.2*  HCT 39.4 36.5*  PLT 697* 611*   BMET Recent Labs    08/28/20 0243 08/29/20 1238  NA 131* 133*  K 4.5 5.4*  CL 95* 97*  CO2 27 24  GLUCOSE 97 135*  BUN 9 15  CREATININE 0.78 1.13  CALCIUM 9.2 8.8*   PT/INR No results for input(s): LABPROT, INR in the last 72 hours. CMP     Component Value Date/Time   NA 133 (L) 08/29/2020 1238   K 5.4 (H) 08/29/2020 1238   CL 97 (L) 08/29/2020 1238   CO2 24 08/29/2020 1238   GLUCOSE 135 (H) 08/29/2020 1238   BUN 15  08/29/2020 1238   CREATININE 1.13 08/29/2020 1238   CALCIUM 8.8 (L) 08/29/2020 1238   PROT 7.5 08/27/2020 1125   ALBUMIN 3.7 08/27/2020 1125   AST 26 08/27/2020 1125   ALT 39 08/27/2020 1125   ALKPHOS 78 08/27/2020 1125   BILITOT 0.7 08/27/2020 1125   GFRNONAA >60 08/29/2020 1238   Lipase     Component Value Date/Time   LIPASE 25 08/27/2020 1125       Studies/Results: DG Abd 1 View  Result Date: 08/28/2020 CLINICAL DATA:  NG tube placement EXAM: ABDOMEN - 1 VIEW COMPARISON:  CT 08/27/2020 FINDINGS: There is a nasogastric tube with side port overlying the distal esophagus and tip overlying the stomach. There is diffuse small and large bowel dilation consistent with known large bowel obstruction. No acute osseous abnormality. IMPRESSION: Nasogastric tube side port overlies the distal esophagus, recommend advancement by 6.5 cm. Persistent bowel obstruction. Electronically Signed   By: Caprice Renshaw   On: 08/28/2020 17:31   DG Abd Portable 1V  Result Date: 08/29/2020 CLINICAL DATA:  NG tube placement. EXAM: PORTABLE ABDOMEN - 1 VIEW COMPARISON:  08/28/2020.  CT 08/27/2020. FINDINGS: NG tube noted with tip in the upper stomach. Side hole is at the gastroesophageal junction. Advancement of approximately 10 cm should be considered. Persistent distended loops of small and large bowel noted in this patient with known colonic obstruction. No interim change. No free air identified.  IMPRESSION: 1. NG tube noted with tip in the upper stomach. Side hole is at the gastroesophageal junction. Advancement of approximately 10 cm should be considered. 2. Persistent dilated loops of small large bowel noted this patient with known colonic obstruction. Interim change Electronically Signed   By: Maisie Fus  Register   On: 08/29/2020 08:33   DG Abd Portable 1V  Result Date: 08/28/2020 CLINICAL DATA:  NG tube placement EXAM: PORTABLE ABDOMEN - 1 VIEW COMPARISON:  None. FINDINGS: Enteric tube terminates in the gastric  fundus. Distended loops of bowel, including transverse colon, in this patient with known distal colonic obstruction. IMPRESSION: Enteric tube terminates in the gastric fundus. Electronically Signed   By: Charline Bills M.D.   On: 08/28/2020 19:24    Anti-infectives: Anti-infectives (From admission, onward)    Start     Dose/Rate Route Frequency Ordered Stop   08/28/20 2315  piperacillin-tazobactam (ZOSYN) IVPB 3.375 g        3.375 g 12.5 mL/hr over 240 Minutes Intravenous Every 8 hours 08/27/20 2308     08/27/20 1515  piperacillin-tazobactam (ZOSYN) IVPB 3.375 g        3.375 g 12.5 mL/hr over 240 Minutes Intravenous  Once 08/27/20 1507 08/28/20 1901        Assessment/Plan  Colonic (sigmoid)obstruction with inflammation and abscess. - POD1 s/p Hartmans by Dr. Fredricka Bonine 7/6 - pathology pending - NGT output 800 mL. Consider clamping today given stool output however he was severely distended preoperatively - JP drain output 105 mL. Continue drain - foley out today - leukocytosis - 22.5 (22.9). continue IV abx and monitor - multimodal pain control - added Toradol  - ambulate - PT/OT ordered - WOC consulted   FEN: NPO/NGT, NS @ 100 mL/hr ID: zosyn 7/4> VTE: lovenox   Tobacco use - nicotine patch     LOS: 3 days    Eric Form, [facility] 08/30/2020, 7:41 AM Please see Amion for pager number during day hours 7:00am-4:30pm

## 2020-08-30 NOTE — Plan of Care (Signed)
  Problem: Education: Goal: Knowledge of General Education information will improve Description Including pain rating scale, medication(s)/side effects and non-pharmacologic comfort measures Outcome: Progressing   

## 2020-08-30 NOTE — Progress Notes (Signed)
Pt ambulated in hallway at least 3 different times--minimum of 2 full laps around the unit each time. Pt ambulated in room to and from bathroom as needed.  Pt tolerated NG tube clamping for morning meds and walking--approximately 1 hour each time.  Pt permitted to have hard candy to suck on. He went a little overboard early evening. Reminded pt to use hard candies sparingly so as not to skew output recordings.  Pt emptied colostomy bag at toilet with assistance holding NG tube/gown x2 on dayshift.  Pt's sister present with WOC RN for training on how to change the ostomy bag.  Pt had 2 visitors--sister he will be living near for recovery (she has an extra cabin on her property), and a friend from the [employer].  Pt asking for pain medication q2-3 hrs throughout the day.  A/o x4  JP drain put out 20 mL serosanguinous fluid on dayshift. Drain charged at shift change.

## 2020-08-30 NOTE — TOC Initial Note (Signed)
Transition of Care [facility]) - Initial/Assessment Note    Patient Details  Name: Ryan Velasquez MRN: [id number] Date of Birth: May 28, 1970  Transition of Care [facility]) CM/SW Contact:    Kingsley Plan, RN Phone Number: 08/30/2020, 2:15 PM  Clinical Narrative:                 At discharge patient plans to stay with his sister Ryan Velasquez home phone number is [phone] and cell [phone].   Ryan Velasquez address is : [address], [place], [place] [address]   NCM spoke to patient and Ryan Velasquez at bedside.   Patient plans to stay with Ryan Velasquez for a month at discharge.   NCM provided medicare.gov list of home health agencies for Ryan Velasquez's zip code [address] , agencies are : [facility], [facility], [facility] ,[facility] , and [facility]. Patient has a friend who is a HHSP and another friend who is a PA. Patient will discuss home health with his friends and family this evening. NCM will check back with him tomorrow for choices. NCM will call agencies in order of his choices. NCM explained NCM will need to call agencies to confirm they cover his sister's exact address and in network with his insurance and have staffing available .   NCM explained home health RN will not be present daily so him and his sister will need to learn ostomy care, wound care and drain care.   Patient asked NCM for a note to excuse him from work for a month. NCM explained he will need to discuss this with [facility].  Patient and Ryan Velasquez voiced understanding.   NCM will follow up with them tomorrow.   Expected Discharge Plan: Home w Home Health Services     Patient Goals and CMS Choice Patient states their goals for this hospitalization and ongoing recovery are:: to return to home CMS Medicare.gov Compare Post Acute Care list provided to:: Patient Choice offered to / list presented to : Patient, Sibling  Expected Discharge Plan and Services Expected Discharge Plan: Home w Home Health Services    Discharge Planning Services: CM Consult Post Acute Care Choice: Home Health Living arrangements for the past 2 months: Single Family Home                   DME Agency: NA       HH Arranged: RN          Prior Living Arrangements/Services Living arrangements for the past 2 months: Single Family Home Lives with:: Self Patient language and need for interpreter reviewed:: Yes Do you feel safe going back to the place where you live?: Yes      Need for Family Participation in Patient Care: Yes (Comment) Care giver support system in place?: Yes (comment)   Criminal Activity/Legal Involvement Pertinent to Current Situation/Hospitalization: No - Comment as needed  Activities of Daily Living Home Assistive Devices/Equipment: None ADL Screening (condition at time of admission) Patient's cognitive ability adequate to safely complete daily activities?: Yes Is the patient deaf or have difficulty hearing?: No Does the patient have difficulty seeing, even when wearing glasses/contacts?: No Does the patient have difficulty concentrating, remembering, or making decisions?: No Patient able to express need for assistance with ADLs?: Yes Does the patient have difficulty dressing or bathing?: No Independently performs ADLs?: Yes (appropriate for developmental age) Does the patient have difficulty walking or climbing stairs?: No Weakness of Legs: None Weakness of Arms/Hands:  None  Permission Sought/Granted   Permission granted to share information with : Yes, Verbal Permission Granted  Share Information with NAME: Ryan Velasquez sister home [phone], cell [phone]           Emotional Assessment Appearance:: Appears stated age Attitude/Demeanor/Rapport: Engaged Affect (typically observed): Accepting Orientation: : Oriented to Self, Oriented to Place, Oriented to  Time, Oriented to Situation Alcohol / Substance Use: Not Applicable Psych Involvement: No (comment)  Admission diagnosis:   Diverticulitis [K57.92] Stricture of colon [facility]) [K56.699] Patient Active Problem List   Diagnosis Date Noted   Diverticulitis 08/27/2020   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

## 2020-08-31 LAB — CBC
HCT: 30.1 % — ABNORMAL LOW (ref 39.0–52.0)
Hemoglobin: 10.1 g/dL — ABNORMAL LOW (ref 13.0–17.0)
MCH: 35.6 pg — ABNORMAL HIGH (ref 26.0–34.0)
MCHC: 33.6 g/dL (ref 30.0–36.0)
MCV: 106 fL — ABNORMAL HIGH (ref 80.0–100.0)
Platelets: 517 10*3/uL — ABNORMAL HIGH (ref 150–400)
RBC: 2.84 MIL/uL — ABNORMAL LOW (ref 4.22–5.81)
RDW: 14.5 % (ref 11.5–15.5)
WBC: 17.3 10*3/uL — ABNORMAL HIGH (ref 4.0–10.5)
nRBC: 0 % (ref 0.0–0.2)

## 2020-08-31 LAB — BASIC METABOLIC PANEL
Anion gap: 6 (ref 5–15)
BUN: 12 mg/dL (ref 6–20)
CO2: 28 mmol/L (ref 22–32)
Calcium: 8 mg/dL — ABNORMAL LOW (ref 8.9–10.3)
Chloride: 98 mmol/L (ref 98–111)
Creatinine, Ser: 0.78 mg/dL (ref 0.61–1.24)
GFR, Estimated: >60 mL/min (ref >60)
Glucose, Bld: 105 mg/dL — ABNORMAL HIGH (ref 70–99)
Potassium: 3.4 mmol/L — ABNORMAL LOW (ref 3.5–5.1)
Sodium: 132 mmol/L — ABNORMAL LOW (ref 135–145)

## 2020-08-31 LAB — MAGNESIUM: Magnesium: 2.1 mg/dL (ref 1.7–2.4)

## 2020-08-31 MED ORDER — HYDROMORPHONE HCL 1 MG/ML IJ SOLN
0.5000 mg | INTRAMUSCULAR | Status: DC | PRN
  Administered 2020-08-31 – 2020-09-03 (×9): 1 mg via INTRAVENOUS
  Filled 2020-08-31 (×9): qty 1

## 2020-08-31 MED ORDER — POTASSIUM CHLORIDE 10 MEQ/100ML IV SOLN
10.0000 meq | INTRAVENOUS | Status: AC
  Administered 2020-08-31 (×4): 10 meq via INTRAVENOUS
  Filled 2020-08-31 (×4): qty 100

## 2020-08-31 MED ORDER — OXYCODONE HCL 5 MG PO TABS
5.0000 mg | ORAL_TABLET | ORAL | Status: DC | PRN
  Administered 2020-09-02 – 2020-09-04 (×4): 5 mg via ORAL
  Filled 2020-08-31 (×4): qty 1

## 2020-08-31 MED ORDER — ACETAMINOPHEN 500 MG PO TABS
1000.0000 mg | ORAL_TABLET | Freq: Four times a day (QID) | ORAL | Status: DC
  Administered 2020-08-31 – 2020-09-06 (×18): 1000 mg via ORAL
  Filled 2020-08-31 (×22): qty 2

## 2020-08-31 MED ORDER — GABAPENTIN 300 MG PO CAPS
300.0000 mg | ORAL_CAPSULE | Freq: Three times a day (TID) | ORAL | Status: DC
  Administered 2020-08-31 – 2020-09-03 (×9): 300 mg via ORAL
  Filled 2020-08-31 (×9): qty 1

## 2020-08-31 NOTE — TOC Progression Note (Signed)
Transition of Care [facility]) - Progression Note    Patient Details  Name: Ryan Velasquez MRN: [id number] Date of Birth: March 08, 1970  Transition of Care [facility]) CM/SW Contact  Aparna Vanderweele, Adria Devon, RN Phone Number: 08/31/2020, 12:41 PM  Clinical Narrative:     Patient's PCP Dr Lorenza Cambridge  Patient would like HHRN through [facility] [phone] called spoke to Ellis Savage requested referral be faxed to them at [phone] same done. Await determination   Expected Discharge Plan: Home w Home Health Services    Expected Discharge Plan and Services Expected Discharge Plan: Home w Home Health Services   Discharge Planning Services: CM Consult Post Acute Care Choice: Home Health Living arrangements for the past 2 months: Single Family Home                   DME Agency: NA       HH Arranged: RN           Social Determinants of Health (SDOH) Interventions    Readmission Risk Interventions No flowsheet data found.

## 2020-08-31 NOTE — Progress Notes (Signed)
Progress Note  2 Days Post-Op  Subjective: CC: He continues to have abdominal pain requiring IV pain medication every 2-3 hours. Has had more gas and stool output. No nausea or emesis. Ambulating frequently   Objective: Vital signs in last 24 hours: Temp:  [98.1 F (36.7 C)-98.9 F (37.2 C)] 98.1 F (36.7 C) (07/08 0507) Pulse Rate:  [74-107] 74 (07/08 0507) Resp:  [16-19] 16 (07/08 0507) BP: (118-152)/(72-83) 141/72 (07/08 0507) SpO2:  [96 %-98 %] 96 % (07/08 0507) Last BM Date: 08/30/20  Intake/Output from previous day: 07/07 0701 - 07/08 0700 In: 500 [IV Piggyback:500] Out: 295 [Urine:225; Emesis/NG output:50; Drains:20] Intake/Output this shift: No intake/output data recorded.  PE: General: pleasant, WD, male who is standing in room in NAD HEENT: head is normocephalic, atraumatic. Mouth is pink and moist Heart: regular, rate, and rhythm.  Palpable radial pulses bilaterally Lungs:  Respiratory effort nonlabored Abd: soft, +BS, moderate distension which is improved from preop. Appropriate TTP. Colostomy with liquid brown stool. NG cannister with bilious output - 900 mL. Dressing to midline incision c/d/i MS: all 4 extremities are symmetrical with no cyanosis, clubbing, or edema. Skin: warm and dry with no masses, lesions, or rashes Psych: A&Ox3 with an appropriate affect.     Lab Results:  Recent Labs    08/30/20 0047 08/31/20 0045  WBC 22.5* 17.3*  HGB 12.2* 10.1*  HCT 36.5* 30.1*  PLT 611* 517*    BMET Recent Labs    08/30/20 0909 08/31/20 0045  NA 136 132*  K 3.8 3.4*  CL 97* 98  CO2 31 28  GLUCOSE 93 105*  BUN 11 12  CREATININE 0.93 0.78  CALCIUM 8.9 8.0*    PT/INR No results for input(s): LABPROT, INR in the last 72 hours. CMP     Component Value Date/Time   NA 132 (L) 08/31/2020 0045   K 3.4 (L) 08/31/2020 0045   CL 98 08/31/2020 0045   CO2 28 08/31/2020 0045   GLUCOSE 105 (H) 08/31/2020 0045   BUN 12 08/31/2020 0045   CREATININE  0.78 08/31/2020 0045   CALCIUM 8.0 (L) 08/31/2020 0045   PROT 7.5 08/27/2020 1125   ALBUMIN 3.7 08/27/2020 1125   AST 26 08/27/2020 1125   ALT 39 08/27/2020 1125   ALKPHOS 78 08/27/2020 1125   BILITOT 0.7 08/27/2020 1125   GFRNONAA >60 08/31/2020 0045   Lipase     Component Value Date/Time   LIPASE 25 08/27/2020 1125       Studies/Results: DG Abd Portable 1V  Result Date: 08/30/2020 CLINICAL DATA:  Postop ileus, nasogastric tube placement EXAM: PORTABLE ABDOMEN - 1 VIEW COMPARISON:  the previous day's study FINDINGS: Nasogastric tube is in the non dilated stomach, side hole near the GE junction, stable since previous. Multiple gas distended mid abdominal small bowel loops are stable. There is mild gaseous distention of the ascending colon as before. Rectum is decompressed. Presumed drain catheter from a right-sided approach projects over the pelvis. Visualized bones unremarkable. IMPRESSION: 1. Stable nasogastric tube placement just beyond the GE junction. Persisted gaseous dilatation of small and proximal large bowel. Electronically Signed   By: Corlis Leak M.D.   On: 08/30/2020 11:59    Anti-infectives: Anti-infectives (From admission, onward)    Start     Dose/Rate Route Frequency Ordered Stop   08/28/20 2315  piperacillin-tazobactam (ZOSYN) IVPB 3.375 g        3.375 g 12.5 mL/hr over 240 Minutes Intravenous Every 8 hours 08/27/20  2308     08/27/20 1515  piperacillin-tazobactam (ZOSYN) IVPB 3.375 g        3.375 g 12.5 mL/hr over 240 Minutes Intravenous  Once 08/27/20 1507 08/28/20 1901        Assessment/Plan  Colonic (sigmoid)obstruction with inflammation and abscess. - POD2 s/p Hartmans by Dr. Fredricka Bonine 7/6 - pathology without malignancy - reviewed with patient - NGT output ~500 mL. Clamp trial today and check residual. Still with distension and low threshold for returning to suction - JP drain output 20 mL SS. Continue drain - leukocytosis - 17.3 (22.5). continue IV abx  and monitor - multimodal pain control  - replete K IV - ambulate - PT/OT ordered - WOC consulted   FEN: NPO/NGT, NS @ 100 mL/hr ID: zosyn 7/4> VTE: lovenox Foley: removed 7/7   Tobacco use - nicotine patch     LOS: 4 days    Eric Form, [facility] 08/31/2020, 7:59 AM Please see Amion for pager number during day hours 7:00am-4:30pm

## 2020-09-01 LAB — BASIC METABOLIC PANEL
Anion gap: 8 (ref 5–15)
BUN: 5 mg/dL — ABNORMAL LOW (ref 6–20)
CO2: 23 mmol/L (ref 22–32)
Calcium: 8.2 mg/dL — ABNORMAL LOW (ref 8.9–10.3)
Chloride: 99 mmol/L (ref 98–111)
Creatinine, Ser: 0.55 mg/dL — ABNORMAL LOW (ref 0.61–1.24)
GFR, Estimated: >60 mL/min (ref >60)
Glucose, Bld: 81 mg/dL (ref 70–99)
Potassium: 3.4 mmol/L — ABNORMAL LOW (ref 3.5–5.1)
Sodium: 130 mmol/L — ABNORMAL LOW (ref 135–145)

## 2020-09-01 LAB — CBC
HCT: 33.6 % — ABNORMAL LOW (ref 39.0–52.0)
Hemoglobin: 11.3 g/dL — ABNORMAL LOW (ref 13.0–17.0)
MCH: 35.2 pg — ABNORMAL HIGH (ref 26.0–34.0)
MCHC: 33.6 g/dL (ref 30.0–36.0)
MCV: 104.7 fL — ABNORMAL HIGH (ref 80.0–100.0)
Platelets: 541 10*3/uL — ABNORMAL HIGH (ref 150–400)
RBC: 3.21 MIL/uL — ABNORMAL LOW (ref 4.22–5.81)
RDW: 14.5 % (ref 11.5–15.5)
WBC: 15.2 10*3/uL — ABNORMAL HIGH (ref 4.0–10.5)
nRBC: 0 % (ref 0.0–0.2)

## 2020-09-01 NOTE — Progress Notes (Signed)
3 Days Post-Op   Subjective/Chief Complaint: Had an episode of vomiting during the colostomy bag change - he feels that this was due to the smell. Large ostomy output Still requiring pain medication Large amount of serous drainage through drain   Objective: Vital signs in last 24 hours: Temp:  [98.1 F (36.7 C)-99 F (37.2 C)] 98.4 F (36.9 C) (07/09 0817) Pulse Rate:  [78-96] 84 (07/09 0817) Resp:  [16-17] 16 (07/09 0817) BP: (135-155)/(71-87) 142/87 (07/09 0817) SpO2:  [94 %-100 %] 94 % (07/09 0817) Last BM Date: 08/31/20 (thru ostomy)  Intake/Output from previous day: 07/08 0701 - 07/09 0700 In: 2187.8 [P.O.:90; I.V.:1598; IV Piggyback:499.8] Out: 1190 [Emesis/NG output:600; Drains:590] Intake/Output this shift: Total I/O In: -  Out: 50 [Drains:50]  General: pleasant, WD, male who is standing in room in NAD HEENT: head is normocephalic, atraumatic. Mouth is pink and moist Heart: regular, rate, and rhythm.  Palpable radial pulses bilaterally Lungs:  Respiratory effort nonlabored Abd: soft, +BS, mildly distended which is improved from preop. Appropriate TTP. Colostomy with liquid brown stool.  Drain - serous drainage Midline wound - clean; no granulation; minimal drainage MS: all 4 extremities are symmetrical with no cyanosis, clubbing, or edema. Skin: warm and dry with no masses, lesions, or rashes Psych: A&Ox3 with an appropriate affect.  Lab Results:  Recent Labs    08/31/20 0045 09/01/20 0031  WBC 17.3* 15.2*  HGB 10.1* 11.3*  HCT 30.1* 33.6*  PLT 517* 541*   BMET Recent Labs    08/31/20 0045 09/01/20 0031  NA 132* 130*  K 3.4* 3.4*  CL 98 99  CO2 28 23  GLUCOSE 105* 81  BUN 12 5*  CREATININE 0.78 0.55*  CALCIUM 8.0* 8.2*   PT/INR No results for input(s): LABPROT, INR in the last 72 hours. ABG No results for input(s): PHART, HCO3 in the last 72 hours.  Invalid input(s): PCO2, PO2  Studies/Results: DG Abd Portable 1V  Result Date:  08/30/2020 CLINICAL DATA:  Postop ileus, nasogastric tube placement EXAM: PORTABLE ABDOMEN - 1 VIEW COMPARISON:  the previous day's study FINDINGS: Nasogastric tube is in the non dilated stomach, side hole near the GE junction, stable since previous. Multiple gas distended mid abdominal small bowel loops are stable. There is mild gaseous distention of the ascending colon as before. Rectum is decompressed. Presumed drain catheter from a right-sided approach projects over the pelvis. Visualized bones unremarkable. IMPRESSION: 1. Stable nasogastric tube placement just beyond the GE junction. Persisted gaseous dilatation of small and proximal large bowel. Electronically Signed   By: Corlis Leak M.D.   On: 08/30/2020 11:59    Anti-infectives: Anti-infectives (From admission, onward)    Start     Dose/Rate Route Frequency Ordered Stop   08/28/20 2315  piperacillin-tazobactam (ZOSYN) IVPB 3.375 g        3.375 g 12.5 mL/hr over 240 Minutes Intravenous Every 8 hours 08/27/20 2308     08/27/20 1515  piperacillin-tazobactam (ZOSYN) IVPB 3.375 g        3.375 g 12.5 mL/hr over 240 Minutes Intravenous  Once 08/27/20 1507 08/28/20 1901       Assessment/Plan: Colonic (sigmoid)obstruction with inflammation and abscess. - s/p Hartmann's procedure by Dr. Fredricka Bonine 7/6 - pathology without malignancy - reviewed with patient - nausea due to smell - start clear liquids - JP drain output serous.  Continue drain - leukocytosis improving; continue IV abx and monitor - multimodal pain control   - ambulate - PT/OT ordered -  WOC consulted   FEN: NPO/NGT, NS @ 100 mL/hr ID: zosyn 7/4> VTE: lovenox Foley: removed 7/7   Tobacco use - nicotine patch  LOS: 5 days    Wynona Luna 09/01/2020

## 2020-09-02 LAB — CBC
HCT: 36.8 % — ABNORMAL LOW (ref 39.0–52.0)
Hemoglobin: 12.6 g/dL — ABNORMAL LOW (ref 13.0–17.0)
MCH: 35.8 pg — ABNORMAL HIGH (ref 26.0–34.0)
MCHC: 34.2 g/dL (ref 30.0–36.0)
MCV: 104.5 fL — ABNORMAL HIGH (ref 80.0–100.0)
Platelets: 640 10*3/uL — ABNORMAL HIGH (ref 150–400)
RBC: 3.52 MIL/uL — ABNORMAL LOW (ref 4.22–5.81)
RDW: 14.8 % (ref 11.5–15.5)
WBC: 17.8 10*3/uL — ABNORMAL HIGH (ref 4.0–10.5)
nRBC: 0 % (ref 0.0–0.2)

## 2020-09-02 MED ORDER — METOCLOPRAMIDE HCL 5 MG/ML IJ SOLN
10.0000 mg | Freq: Four times a day (QID) | INTRAMUSCULAR | Status: DC
  Administered 2020-09-02 – 2020-09-03 (×4): 10 mg via INTRAVENOUS
  Filled 2020-09-02 (×4): qty 2

## 2020-09-02 NOTE — Progress Notes (Signed)
Dressing changed as ordered. Pt tolerated well. Will continue to monitor.  

## 2020-09-02 NOTE — Progress Notes (Signed)
4 Days Post-Op   Subjective/Chief Complaint: Still having intermittent vomiting - two times yesterday Distended Some ostomy output   Objective: Vital signs in last 24 hours: Temp:  [97.6 F (36.4 C)-99.6 F (37.6 C)] 97.6 F (36.4 C) (07/10 0740) Pulse Rate:  [101-110] 103 (07/10 0740) Resp:  [16-20] 17 (07/10 0740) BP: (137-163)/(85-97) 140/88 (07/10 0740) SpO2:  [91 %-95 %] 93 % (07/10 0740) Last BM Date: 09/01/20  Intake/Output from previous day: 07/09 0701 - 07/10 0700 In: 2520.7 [P.O.:100; I.V.:2020.7; IV Piggyback:400] Out: 461 [Emesis/NG output:1; Drains:160; Stool:300] Intake/Output this shift: Total I/O In: 0  Out: 50 [Emesis/NG output:50]  General: pleasant, WD, male NAD HEENT: head is normocephalic, atraumatic. Mouth is pink and moist Heart: regular, rate, and rhythm.  Palpable radial pulses bilaterally Lungs:  Respiratory effort nonlabored Abd: soft, +BS, mildly distended which is improved from preop. Appropriate TTP. Colostomy with liquid brown stool.  Drain - serous drainage Midline wound - clean; no granulation; minimal drainage MS: all 4 extremities are symmetrical with no cyanosis, clubbing, or edema. Skin: warm and dry with no masses, lesions, or rashes Psych: A&Ox3 with an appropriate affect.  Lab Results:  Recent Labs    09/01/20 0031 09/02/20 0022  WBC 15.2* 17.8*  HGB 11.3* 12.6*  HCT 33.6* 36.8*  PLT 541* 640*   BMET Recent Labs    08/31/20 0045 09/01/20 0031  NA 132* 130*  K 3.4* 3.4*  CL 98 99  CO2 28 23  GLUCOSE 105* 81  BUN 12 5*  CREATININE 0.78 0.55*  CALCIUM 8.0* 8.2*   PT/INR No results for input(s): LABPROT, INR in the last 72 hours. ABG No results for input(s): PHART, HCO3 in the last 72 hours.  Invalid input(s): PCO2, PO2  Studies/Results: No results found.  Anti-infectives: Anti-infectives (From admission, onward)    Start     Dose/Rate Route Frequency Ordered Stop   08/28/20 2315  piperacillin-tazobactam  (ZOSYN) IVPB 3.375 g        3.375 g 12.5 mL/hr over 240 Minutes Intravenous Every 8 hours 08/27/20 2308     08/27/20 1515  piperacillin-tazobactam (ZOSYN) IVPB 3.375 g        3.375 g 12.5 mL/hr over 240 Minutes Intravenous  Once 08/27/20 1507 08/28/20 1901       Assessment/Plan: Colonic (sigmoid)obstruction with inflammation and abscess. - s/p Hartmann's procedure by Dr. Fredricka Bonine 7/6 - pathology without malignancy - reviewed with patient - Post-op ileus - sips of clear liquids/ add Reglan 10 mg IV q6 for a couple of days - JP drain output serous.  Continue drain - leukocytosis - if continues to increase, will consider CT scan/ continue IV abx and monitor - multimodal pain control    - ambulate - PT/OT ordered - WOC consulted   FEN: NPO/NGT, NS @ 100 mL/hr ID: zosyn 7/4> VTE: lovenox Foley: removed 7/7   Tobacco use - nicotine patch  LOS: 6 days    Ryan Velasquez 09/02/2020

## 2020-09-02 NOTE — Plan of Care (Signed)
  Problem: Education: Goal: Knowledge of General Education information will improve Description: Including pain rating scale, medication(s)/side effects and non-pharmacologic comfort measures Outcome: Progressing   Problem: Health Behavior/Discharge Planning: Goal: Ability to manage health-related needs will improve Outcome: Progressing   Problem: Clinical Measurements: Goal: Ability to maintain clinical measurements within normal limits will improve Outcome: Progressing   Problem: Elimination: Goal: Will not experience complications related to bowel motility Outcome: Progressing   Problem: Pain Managment: Goal: General experience of comfort will improve Outcome: Progressing   Problem: Safety: Goal: Ability to remain free from injury will improve Outcome: Progressing   Problem: Skin Integrity: Goal: Risk for impaired skin integrity will decrease Outcome: Progressing   

## 2020-09-03 LAB — CBC
HCT: 36 % — ABNORMAL LOW (ref 39.0–52.0)
Hemoglobin: 12.1 g/dL — ABNORMAL LOW (ref 13.0–17.0)
MCH: 35.3 pg — ABNORMAL HIGH (ref 26.0–34.0)
MCHC: 33.6 g/dL (ref 30.0–36.0)
MCV: 105 fL — ABNORMAL HIGH (ref 80.0–100.0)
Platelets: 634 10*3/uL — ABNORMAL HIGH (ref 150–400)
RBC: 3.43 MIL/uL — ABNORMAL LOW (ref 4.22–5.81)
RDW: 14.9 % (ref 11.5–15.5)
WBC: 21 10*3/uL — ABNORMAL HIGH (ref 4.0–10.5)
nRBC: 0 % (ref 0.0–0.2)

## 2020-09-03 LAB — BASIC METABOLIC PANEL
Anion gap: 11 (ref 5–15)
BUN: 8 mg/dL (ref 6–20)
CO2: 27 mmol/L (ref 22–32)
Calcium: 8.6 mg/dL — ABNORMAL LOW (ref 8.9–10.3)
Chloride: 95 mmol/L — ABNORMAL LOW (ref 98–111)
Creatinine, Ser: 0.75 mg/dL (ref 0.61–1.24)
GFR, Estimated: >60 mL/min (ref >60)
Glucose, Bld: 94 mg/dL (ref 70–99)
Potassium: 3.2 mmol/L — ABNORMAL LOW (ref 3.5–5.1)
Sodium: 133 mmol/L — ABNORMAL LOW (ref 135–145)

## 2020-09-03 MED ORDER — WHITE PETROLATUM EX OINT
TOPICAL_OINTMENT | CUTANEOUS | Status: AC
  Filled 2020-09-03: qty 28.35

## 2020-09-03 MED ORDER — NICOTINE 14 MG/24HR TD PT24: 14.0000 mg | MEDICATED_PATCH | Freq: Every day | TRANSDERMAL | Status: DC

## 2020-09-03 MED ORDER — HYDROMORPHONE HCL 1 MG/ML IJ SOLN
0.5000 mg | INTRAMUSCULAR | Status: DC | PRN
  Administered 2020-09-03 – 2020-09-09 (×24): 1 mg via INTRAVENOUS
  Administered 2020-09-09: 0.5 mg via INTRAVENOUS
  Administered 2020-09-09 – 2020-09-14 (×25): 1 mg via INTRAVENOUS
  Filled 2020-09-03 (×52): qty 1

## 2020-09-03 NOTE — Progress Notes (Signed)
Progress Note  5 Days Post-Op  Subjective: CC: had another episode of emesis this am - bilious. He has been able to drink some clears. Still having abdominal pain requiring IV pain medication. Have gas and stool output.   Objective: Vital signs in last 24 hours: Temp:  [98.1 F (36.7 C)-99.3 F (37.4 C)] 98.6 F (37 C) (07/11 0410) Pulse Rate:  [104-109] 108 (07/11 0410) Resp:  [18] 18 (07/11 0410) BP: (131-143)/(79-91) 133/79 (07/11 0410) SpO2:  [92 %-96 %] 92 % (07/11 0410) Last BM Date: 09/02/20  Intake/Output from previous day: 07/10 0701 - 07/11 0700 In: 640 [P.O.:640] Out: 180 [Emesis/NG output:50; Drains:80; Stool:50] Intake/Output this shift: No intake/output data recorded.  PE: General: pleasant, WD, male who is laying in bed in NAD HEENT: head is normocephalic, atraumatic. Mouth is pink and moist Heart: regular, rate, and rhythm.  Palpable radial pulses bilaterally Lungs:  Respiratory effort nonlabored on room air Abd: soft, +BS - hypoactive, moderate distension. Appropriate TTP. Colostomy with liquid brown stool. Dressing to midline incision c/d/I. Drain with serous output MS: all 4 extremities are symmetrical with no cyanosis, clubbing, or edema. Skin: warm and dry with no masses, lesions, or rashes Psych: A&Ox3 with an appropriate affect.   Lab Results:  Recent Labs    09/02/20 0022 09/03/20 0034  WBC 17.8* 21.0*  HGB 12.6* 12.1*  HCT 36.8* 36.0*  PLT 640* 634*   BMET Recent Labs    09/01/20 0031  NA 130*  K 3.4*  CL 99  CO2 23  GLUCOSE 81  BUN 5*  CREATININE 0.55*  CALCIUM 8.2*   PT/INR No results for input(s): LABPROT, INR in the last 72 hours. CMP     Component Value Date/Time   NA 130 (L) 09/01/2020 0031   K 3.4 (L) 09/01/2020 0031   CL 99 09/01/2020 0031   CO2 23 09/01/2020 0031   GLUCOSE 81 09/01/2020 0031   BUN 5 (L) 09/01/2020 0031   CREATININE 0.55 (L) 09/01/2020 0031   CALCIUM 8.2 (L) 09/01/2020 0031   PROT 7.5  08/27/2020 1125   ALBUMIN 3.7 08/27/2020 1125   AST 26 08/27/2020 1125   ALT 39 08/27/2020 1125   ALKPHOS 78 08/27/2020 1125   BILITOT 0.7 08/27/2020 1125   GFRNONAA >60 09/01/2020 0031   Lipase     Component Value Date/Time   LIPASE 25 08/27/2020 1125       Studies/Results: No results found.  Anti-infectives: Anti-infectives (From admission, onward)    Start     Dose/Rate Route Frequency Ordered Stop   08/28/20 2315  piperacillin-tazobactam (ZOSYN) IVPB 3.375 g        3.375 g 12.5 mL/hr over 240 Minutes Intravenous Every 8 hours 08/27/20 2308     08/27/20 1515  piperacillin-tazobactam (ZOSYN) IVPB 3.375 g        3.375 g 12.5 mL/hr over 240 Minutes Intravenous  Once 08/27/20 1507 08/28/20 1901        Assessment/Plan  Colonic (sigmoid)obstruction with inflammation and abscess. - POD5 s/p Hartmann's procedure by Dr. Fredricka Bonine 7/6 - pathology without malignancy - reviewed with patient - Post-op ileus - continue sips of clear liquids today - JP drain output serous.  Continue drain - WBC 21.0 - may  get CT scan today or tomorrow, continue IV abx - will check BMP today - decrease IVF - multimodal pain control    - ambulate - PT/OT ordered - WOC consulted   FEN: sips of clears, NS @ 50  mL/hr ID: zosyn 7/4> VTE: lovenox Foley: removed 7/7   Tobacco use - nicotine patch    LOS: 7 days    Eric Form, [facility] 09/03/2020, 8:02 AM Please see Amion for pager number during day hours 7:00am-4:30pm

## 2020-09-03 NOTE — TOC Progression Note (Addendum)
Transition of Care [facility]) - Progression Note    Patient Details  Name: Ryan Velasquez MRN: [id number] Date of Birth: 01-Dec-1970  Transition of Care [facility]) CM/SW Contact  Nadene Rubins Adria Devon, RN Phone Number: 09/03/2020, 12:21 PM  Clinical Narrative:     NCM followed up with [facility] phone [phone] , spoke to [facility] , patient has been accepted for [facility]. They will need discharge summary faxed to them at [phone].   Sarah at [facility] received updated clinicals, her direct number is [phone] ext [phone]   Patient aware [facility] will be providing home health nurse. Patient continues to be taught wound and ostomy care.   Expected Discharge Plan: Home w Home Health Services    Expected Discharge Plan and Services Expected Discharge Plan: Home w Home Health Services   Discharge Planning Services: CM Consult Post Acute Care Choice: Home Health Living arrangements for the past 2 months: Single Family Home                   DME Agency: NA       HH Arranged: RN           Social Determinants of Health (SDOH) Interventions    Readmission Risk Interventions No flowsheet data found.

## 2020-09-03 NOTE — Plan of Care (Signed)

## 2020-09-03 NOTE — Consult Note (Signed)
WOC Nurse ostomy follow up Patient receiving care in [facility] 6N02. Stoma type/location: LUQ colostomy Stomal assessment/size: 1 1/4 inches side to side, 1 inch top to bottom; red, moist, sutures intact Peristomal assessment: intact Treatment options for stomal/peristomal skin: barrier ring, convex pouch Output: small amount brown liquid feces Ostomy pouching: 1pc. Arelia Longest #[id number], barrier ring [place] [phone] Education provided: Patient removed existing pouch, cleaned around stoma, closed new pouch, placed barrier ring on wafer back, removed tape borders. Patient participated in placing new pouch onto abdomen.  He has agreed to begin reviewing materials in the [employer]. Enrolled patient in [place] Secure [employer] Discharge program: Yes last week.    Patient states he will be going to his sister's house when discharged.  Helmut Muster, RN, MSN, CWOCN, CNS-BC, pager [phone]

## 2020-09-04 ENCOUNTER — Inpatient Hospital Stay (HOSPITAL_COMMUNITY): Payer: BC Managed Care – PPO

## 2020-09-04 LAB — CBC
HCT: 39.5 % (ref 39.0–52.0)
Hemoglobin: 13.3 g/dL (ref 13.0–17.0)
MCH: 34.7 pg — ABNORMAL HIGH (ref 26.0–34.0)
MCHC: 33.7 g/dL (ref 30.0–36.0)
MCV: 103.1 fL — ABNORMAL HIGH (ref 80.0–100.0)
Platelets: 695 10*3/uL — ABNORMAL HIGH (ref 150–400)
RBC: 3.83 MIL/uL — ABNORMAL LOW (ref 4.22–5.81)
RDW: 14.6 % (ref 11.5–15.5)
WBC: 18.4 10*3/uL — ABNORMAL HIGH (ref 4.0–10.5)
nRBC: 0 % (ref 0.0–0.2)

## 2020-09-04 LAB — BASIC METABOLIC PANEL
Anion gap: 13 (ref 5–15)
BUN: 12 mg/dL (ref 6–20)
CO2: 28 mmol/L (ref 22–32)
Calcium: 9.1 mg/dL (ref 8.9–10.3)
Chloride: 94 mmol/L — ABNORMAL LOW (ref 98–111)
Creatinine, Ser: 0.85 mg/dL (ref 0.61–1.24)
GFR, Estimated: >60 mL/min (ref >60)
Glucose, Bld: 83 mg/dL (ref 70–99)
Potassium: 3.5 mmol/L (ref 3.5–5.1)
Sodium: 135 mmol/L (ref 135–145)

## 2020-09-04 MED ORDER — OXYCODONE HCL 5 MG PO TABS
5.0000 mg | ORAL_TABLET | ORAL | Status: DC | PRN
Start: 2020-09-04 — End: 2020-09-15
  Administered 2020-09-04 – 2020-09-11 (×16): 10 mg via ORAL
  Administered 2020-09-12: 5 mg via ORAL
  Administered 2020-09-12 – 2020-09-13 (×4): 10 mg via ORAL
  Administered 2020-09-14: 5 mg via ORAL
  Administered 2020-09-14 – 2020-09-15 (×4): 10 mg via ORAL
  Filled 2020-09-04 (×28): qty 2

## 2020-09-04 MED ORDER — IOHEXOL 300 MG/ML  SOLN
100.0000 mL | Freq: Once | INTRAMUSCULAR | Status: AC | PRN
  Administered 2020-09-04: 100 mL via INTRAVENOUS

## 2020-09-04 MED ORDER — IOHEXOL 9 MG/ML PO SOLN
ORAL | Status: AC
  Administered 2020-09-04: 500 mL
  Filled 2020-09-04: qty 1000

## 2020-09-04 MED ORDER — KETOROLAC TROMETHAMINE 15 MG/ML IJ SOLN
15.0000 mg | Freq: Four times a day (QID) | INTRAMUSCULAR | Status: AC | PRN
  Administered 2020-09-05 – 2020-09-09 (×4): 15 mg via INTRAVENOUS
  Filled 2020-09-04 (×5): qty 1

## 2020-09-04 NOTE — Consult Note (Signed)
WOC Nurse ostomy follow up Patient receiving care in [facility] 6N02. Patient's sister, Lura Em, with whom he will be discharging home to, is present today.  She and I reviewed the box of supplies sent from [facility] for the [employer] enrollment.  I explained that we will send him home with 5 -  6 pouches and barrier rings.  The size pouch that is included in the Secure Start package was the 1.5 inch convex, which will likely be just fine once the stoma has decreased in size.  I did encourage her to go ahead and contact the company using their printed instructions, and request a box of the size he is using in hospital.  She agreed to do so.  Also, she informed me his home health has already been set up.  My plan is for him to complete another pouch change this week on Thursday.  Supplies are in the room for this.  Helmut Muster, RN, MSN, CWOCN, CNS-BC, pager [phone]

## 2020-09-04 NOTE — Plan of Care (Signed)

## 2020-09-04 NOTE — Progress Notes (Signed)
Progress Note  6 Days Post-Op  Subjective: CC: pain and distension both improved yesterday and today but emesis continues. 4-5 episodes of emesis yesterday and one so far this am. He states it is similar to acid reflux and denies any nausea currently  Objective: Vital signs in last 24 hours: Temp:  [98 F (36.7 C)-99.3 F (37.4 C)] 98.1 F (36.7 C) (07/12 0745) Pulse Rate:  [106-122] 111 (07/12 0745) Resp:  [17-20] 20 (07/12 0745) BP: (119-165)/(76-88) 154/86 (07/12 0745) SpO2:  [92 %-98 %] 97 % (07/12 0745) Last BM Date: 09/03/20  Intake/Output from previous day: 07/11 0701 - 07/12 0700 In: -  Out: 610 [Drains:110; Stool:500] Intake/Output this shift: No intake/output data recorded.  PE: General: pleasant, WD, male who is laying in bed in NAD HEENT: head is normocephalic, atraumatic. Mouth is pink and moist Heart: regular, rate, and rhythm.  Palpable radial pulses bilaterally Lungs:  Respiratory effort nonlabored on room air Abd: soft, +BS - hypoactive, moderate distension. Appropriate mild diffuse TTP. Colostomy with liquid brown stool. Dressing to midline incision c/d/I - removed for exam and wound with granulomatous tissue and appropriate bleeding. Drain with serous output MS: all 4 extremities are symmetrical with no cyanosis, clubbing, or edema. Skin: warm and dry with no masses, lesions, or rashes Psych: A&Ox3 with an appropriate affect.    Lab Results:  Recent Labs    09/03/20 0034 09/04/20 0001  WBC 21.0* 18.4*  HGB 12.1* 13.3  HCT 36.0* 39.5  PLT 634* 695*    BMET Recent Labs    09/03/20 1205 09/04/20 0001  NA 133* 135  K 3.2* 3.5  CL 95* 94*  CO2 27 28  GLUCOSE 94 83  BUN 8 12  CREATININE 0.75 0.85  CALCIUM 8.6* 9.1    PT/INR No results for input(s): LABPROT, INR in the last 72 hours. CMP     Component Value Date/Time   NA 135 09/04/2020 0001   K 3.5 09/04/2020 0001   CL 94 (L) 09/04/2020 0001   CO2 28 09/04/2020 0001   GLUCOSE 83  09/04/2020 0001   BUN 12 09/04/2020 0001   CREATININE 0.85 09/04/2020 0001   CALCIUM 9.1 09/04/2020 0001   PROT 7.5 08/27/2020 1125   ALBUMIN 3.7 08/27/2020 1125   AST 26 08/27/2020 1125   ALT 39 08/27/2020 1125   ALKPHOS 78 08/27/2020 1125   BILITOT 0.7 08/27/2020 1125   GFRNONAA >60 09/04/2020 0001   Lipase     Component Value Date/Time   LIPASE 25 08/27/2020 1125       Studies/Results: No results found.  Anti-infectives: Anti-infectives (From admission, onward)    Start     Dose/Rate Route Frequency Ordered Stop   08/28/20 2315  piperacillin-tazobactam (ZOSYN) IVPB 3.375 g        3.375 g 12.5 mL/hr over 240 Minutes Intravenous Every 8 hours 08/27/20 2308     08/27/20 1515  piperacillin-tazobactam (ZOSYN) IVPB 3.375 g        3.375 g 12.5 mL/hr over 240 Minutes Intravenous  Once 08/27/20 1507 08/28/20 1901        Assessment/Plan  Colonic (sigmoid)obstruction with inflammation and abscess. - POD6 s/p Hartmann's procedure by Dr. Fredricka Bonine 7/6 - pathology without malignancy - reviewed with patient - Post-op ileus - continue sips of clear liquids today - JP drain output serous.  Continue drain - WBC 18.4 (21.0) - CT scan today for further evaluation. monitor - reglan and neurontin stopped - multimodal pain control -  improving pain control   - ambulate - PT/OT ordered - WOC consulted   FEN: sips of clears, NS @ 50 mL/hr ID: zosyn 7/4> VTE: lovenox Foley: removed 7/7   Tobacco use - nicotine patch    LOS: 8 days    Eric Form, [facility] Surgery 09/04/2020, 7:53 AM Please see Amion for pager number during day hours 7:00am-4:30pm

## 2020-09-05 ENCOUNTER — Inpatient Hospital Stay: Payer: Self-pay

## 2020-09-05 LAB — BASIC METABOLIC PANEL
Anion gap: 14 (ref 5–15)
BUN: 11 mg/dL (ref 6–20)
CO2: 29 mmol/L (ref 22–32)
Calcium: 8.5 mg/dL — ABNORMAL LOW (ref 8.9–10.3)
Chloride: 90 mmol/L — ABNORMAL LOW (ref 98–111)
Creatinine, Ser: 0.97 mg/dL (ref 0.61–1.24)
GFR, Estimated: >60 mL/min (ref >60)
Glucose, Bld: 95 mg/dL (ref 70–99)
Potassium: 2.9 mmol/L — ABNORMAL LOW (ref 3.5–5.1)
Sodium: 133 mmol/L — ABNORMAL LOW (ref 135–145)

## 2020-09-05 LAB — CBC
HCT: 37.4 % — ABNORMAL LOW (ref 39.0–52.0)
Hemoglobin: 12.6 g/dL — ABNORMAL LOW (ref 13.0–17.0)
MCH: 34.8 pg — ABNORMAL HIGH (ref 26.0–34.0)
MCHC: 33.7 g/dL (ref 30.0–36.0)
MCV: 103.3 fL — ABNORMAL HIGH (ref 80.0–100.0)
Platelets: 659 10*3/uL — ABNORMAL HIGH (ref 150–400)
RBC: 3.62 MIL/uL — ABNORMAL LOW (ref 4.22–5.81)
RDW: 14.6 % (ref 11.5–15.5)
WBC: 20 10*3/uL — ABNORMAL HIGH (ref 4.0–10.5)
nRBC: 0 % (ref 0.0–0.2)

## 2020-09-05 LAB — GLUCOSE, CAPILLARY
Glucose-Capillary: 125 mg/dL — ABNORMAL HIGH (ref 70–99)
Glucose-Capillary: 97 mg/dL (ref 70–99)

## 2020-09-05 LAB — MAGNESIUM: Magnesium: 1.9 mg/dL (ref 1.7–2.4)

## 2020-09-05 LAB — PHOSPHORUS: Phosphorus: 3.2 mg/dL (ref 2.5–4.6)

## 2020-09-05 MED ORDER — SODIUM CHLORIDE 0.9% FLUSH
10.0000 mL | INTRAVENOUS | Status: DC | PRN
  Administered 2020-09-07 – 2020-09-17 (×2): 10 mL

## 2020-09-05 MED ORDER — INSULIN ASPART 100 UNIT/ML IJ SOLN
0.0000 [IU] | Freq: Four times a day (QID) | INTRAMUSCULAR | Status: DC
  Administered 2020-09-06 (×3): 1 [IU] via SUBCUTANEOUS
  Administered 2020-09-06: 2 [IU] via SUBCUTANEOUS
  Administered 2020-09-07: 1 [IU] via SUBCUTANEOUS

## 2020-09-05 MED ORDER — POTASSIUM CHLORIDE 10 MEQ/50ML IV SOLN
10.0000 meq | INTRAVENOUS | Status: AC
  Administered 2020-09-05 (×6): 10 meq via INTRAVENOUS
  Filled 2020-09-05 (×6): qty 50

## 2020-09-05 MED ORDER — MAGNESIUM SULFATE IN D5W 1-5 GM/100ML-% IV SOLN
1.0000 g | Freq: Once | INTRAVENOUS | Status: AC
  Administered 2020-09-05: 1 g via INTRAVENOUS
  Filled 2020-09-05: qty 100

## 2020-09-05 MED ORDER — CHLORHEXIDINE GLUCONATE CLOTH 2 % EX PADS
6.0000 | MEDICATED_PAD | Freq: Every day | CUTANEOUS | Status: DC
  Administered 2020-09-05 – 2020-09-19 (×12): 6 via TOPICAL

## 2020-09-05 MED ORDER — TRAVASOL 10 % IV SOLN
INTRAVENOUS | Status: AC
  Filled 2020-09-05: qty 370.8

## 2020-09-05 MED ORDER — SODIUM CHLORIDE 0.9% FLUSH
10.0000 mL | Freq: Two times a day (BID) | INTRAVENOUS | Status: DC
  Administered 2020-09-05 – 2020-09-18 (×9): 10 mL

## 2020-09-05 MED ORDER — SODIUM CHLORIDE 0.9 % IV SOLN: INTRAVENOUS | Status: AC

## 2020-09-05 NOTE — Progress Notes (Signed)
Peripherally Inserted Central Catheter Placement  The IV Nurse has discussed with the patient and/or persons authorized to consent for the patient, the purpose of this procedure and the potential benefits and risks involved with this procedure.  The benefits include less needle sticks, lab draws from the catheter, and the patient may be discharged home with the catheter. Risks include, but not limited to, infection, bleeding, blood clot (thrombus formation), and puncture of an artery; nerve damage and irregular heartbeat and possibility to perform a PICC exchange if needed/ordered by physician.  Alternatives to this procedure were also discussed.  Bard Power PICC patient education guide, fact sheet on infection prevention and patient information card has been provided to patient /or left at bedside.    PICC Placement Documentation  PICC Double Lumen 09/05/20 Right Basilic 40 cm 0 cm (Active)  Indication for Insertion or Continuance of Line Administration of hyperosmolar/irritating solutions (i.e. TPN, Vancomycin, etc.) 09/05/20 1000  Exposed Catheter (cm) 0 cm 09/05/20 1000  Site Assessment Clean;Dry;Intact 09/05/20 1000  Lumen #1 Status Flushed;Saline locked;Blood return noted 09/05/20 1000  Lumen #2 Status Flushed;Saline locked;Blood return noted 09/05/20 1000  Dressing Type Transparent;Securing device 09/05/20 1000  Dressing Status Clean;Dry;Intact 09/05/20 1000  Antimicrobial disc in place? Yes 09/05/20 1000  Dressing Intervention New dressing 09/05/20 1000  Dressing Change Due 09/12/20 09/05/20 1000       Annett Fabian 09/05/2020, 10:14 AM

## 2020-09-05 NOTE — Progress Notes (Signed)
7 Days Post-Op  Subjective: CC: Bloating/tightness/nausea improved since NGT placement yesterday. 4.9L/24 hours. He is eating some ice. NGT does not appear to be within duo on xray yesterady. He denies any pain in his abdomen except when ambulating this am. Still having some ostomy output. Mobilizing. Voiding.   Objective: Vital signs in last 24 hours: Temp:  [97.6 F (36.4 C)-98.3 F (36.8 C)] 97.6 F (36.4 C) (07/13 0802) Pulse Rate:  [100-110] 102 (07/13 0802) Resp:  [16-20] 16 (07/13 0802) BP: (132-146)/(78-89) 144/88 (07/13 0802) SpO2:  [95 %-100 %] 100 % (07/13 0802) Last BM Date: 09/04/20  Intake/Output from previous day: 07/12 0701 - 07/13 0700 In: 664.9 [P.O.:120; I.V.:544.9] Out: 5980 [Urine:500; Emesis/NG output:4900; Drains:30; Stool:550] Intake/Output this shift: No intake/output data recorded.  PE: Gen:  Alert, NAD, pleasant Card:  Reg Pulm:  CTAB, no W/R/R, effort normal Abd: Soft, mild distension, appropriately tender around midline. Hypoactive bowel sounds. Midline wound stable from picture on 7/12. No dehiscence. Colostomy bag with small amount of liquid stool in bag. Unable to see stoma 2/2 stool. JP w/ serous output. NGT in place with bilious output in cannister.   Ext:  No LE edema  Psych: A&Ox3  Skin: no rashes noted, warm and dry   Lab Results:  Recent Labs    09/04/20 0001 09/05/20 0311  WBC 18.4* 20.0*  HGB 13.3 12.6*  HCT 39.5 37.4*  PLT 695* 659*   BMET Recent Labs    09/03/20 1205 09/04/20 0001  NA 133* 135  K 3.2* 3.5  CL 95* 94*  CO2 27 28  GLUCOSE 94 83  BUN 8 12  CREATININE 0.75 0.85  CALCIUM 8.6* 9.1   PT/INR No results for input(s): LABPROT, INR in the last 72 hours. CMP     Component Value Date/Time   NA 135 09/04/2020 0001   K 3.5 09/04/2020 0001   CL 94 (L) 09/04/2020 0001   CO2 28 09/04/2020 0001   GLUCOSE 83 09/04/2020 0001   BUN 12 09/04/2020 0001   CREATININE 0.85 09/04/2020 0001   CALCIUM 9.1  09/04/2020 0001   PROT 7.5 08/27/2020 1125   ALBUMIN 3.7 08/27/2020 1125   AST 26 08/27/2020 1125   ALT 39 08/27/2020 1125   ALKPHOS 78 08/27/2020 1125   BILITOT 0.7 08/27/2020 1125   GFRNONAA >60 09/04/2020 0001   Lipase     Component Value Date/Time   LIPASE 25 08/27/2020 1125       Studies/Results: CT ABDOMEN PELVIS W CONTRAST  Result Date: 09/04/2020 CLINICAL DATA:  Postoperative leukocytosis and ileus, concern for postoperative leak or abscess. EXAM: CT ABDOMEN AND PELVIS WITH CONTRAST TECHNIQUE: Multidetector CT imaging of the abdomen and pelvis was performed using the standard protocol following bolus administration of intravenous contrast. CONTRAST:  OMNIPAQUE IOHEXOL 300 MG/ML  SOLN COMPARISON:  CT August 27, 2020. FINDINGS: Lower chest: Patchy right greater than left nodular ground-glass opacities and consolidations with more confluent consolidations in the right lower lobe. No pleural effusion. Hepatobiliary: Diffuse hepatic steatosis. Subcentimeter cyst in the anterior right lobe of the liver on image 13/3. Gallbladder is unremarkable. No biliary ductal dilation. Pancreas: Within normal limits. Spleen: Within normal limits. Adrenals/Urinary Tract: Adrenal glands are unremarkable. Kidneys are normal, without renal calculi, solid enhancing lesion, or hydronephrosis. Bladder is unremarkable for degree of distension. Stomach/Bowel: Postsurgical changes of partial colectomy with left anterior abdominal wall colostomy formation. Stomach is dilated with gas and ingested contents. There is diffuse dilation of  gas and fluid-filled small bowel measuring up to 4.9 cm. Appendix is unremarkable. Gas and fluid levels throughout distended ascending and transverse colon with relative normal caliber colon extending from the hepatic flexure to the left anterior abdominal wall colostomy site which contains a small volume of stool and gas. Vascular/Lymphatic: Aortic atherosclerosis without aneurysmal  dilation. No pathologically enlarged abdominal or pelvic lymph nodes. Reproductive: Prostate is unremarkable. Other: Small volume ascites. Non approximated ventral anterior abdominal wall incision. Mild diffuse body wall edema. Right anterior abdominal approach surgical drain coiled in the pelvis. Musculoskeletal: No acute osseous abnormality. IMPRESSION: 1. Postsurgical changes of partial colectomy with left anterior abdominal wall colostomy. 2. Diffuse dilation of gas and fluid-filled small bowel measuring up to 4.9 cm. Gas and fluid levels throughout distended ascending and transverse colon with relative normal caliber colon extending from the hepatic flexure to the left anterior abdominal wall colostomy site which contains a small volume of stool and gas. Findings are favored to represent postoperative ileus. Continued radiographic follow-up to resolution is suggested. 3. Small volume ascites. No drainable fluid collections identified and no findings to suggest bowel perforation. 4. Patchy right greater than left nodular ground-glass opacities and consolidations with more confluent consolidations in the right lower lobe. Findings are concerning for multifocal pneumonia. 5. Diffuse hepatic steatosis. 6.  Aortic Atherosclerosis (ICD10-I70.0). Electronically Signed   By: Maudry Mayhew MD   On: 09/04/2020 16:11   DG Abd Portable 1V  Result Date: 09/04/2020 CLINICAL DATA:  Check gastric catheter placement EXAM: PORTABLE ABDOMEN - 1 VIEW COMPARISON:  08/30/20 FINDINGS: Gastric catheter is noted within the stomach. Small bowel dilatation is seen. IMPRESSION: Gastric catheter within the stomach. Persistent dilatation of the small bowel. Electronically Signed   By: Alcide Clever M.D.   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 Fredricka Bonine 08/29/20 - Pathology without malignancy  - CT 7/12 w/ ileus. No IAA - On abx. Will discuss with MD duration - Cont NPO, NGT - Start PICC/TPN - JP drain output serous.  Continue drain - BID WTD for midline - Mobilize - Pulm toilet - WOC following for new ostomy teaching. He reports his sister will help at discharge   FEN: NPO, NGT ID: zosyn 7/4> (will discuss with MD duration) VTE: SCDs, Lovenox Foley: removed 7/7, voiding   Tobacco use - nicotine patch    LOS: 9 days    Jacinto Halim , [facility] 09/05/2020, 8:09 AM Please see Amion for pager number during day hours 7:00am-4:30pm

## 2020-09-05 NOTE — Progress Notes (Signed)
PHARMACY - TOTAL PARENTERAL NUTRITION CONSULT NOTE  Indication: Prolonged ileus  Patient Measurements: Height: 5\' 8"  (172.7 cm) Weight: 74.8 kg (165 lb) IBW/kg (Calculated) : 68.4 TPN AdjBW (KG): 74.8 Body mass index is 25.09 kg/m. Usual Weight: 93 kg  Assessment:  [name] presented on 7/4 with nausea, vomiting and abdominal pain for 2 days following constipation.  Found to have colonic obstruction with mass and abscess requiring ex-lap with sigmoid colectomy and end colostomy on 7/6.  Patient has nausea and vomiting post-op and was unable to start a diet.  CT on 7/12 showed post-op ileus.  Pharmacy consulted for TPN management given inadequate intake for ~12 days.  Patient reports having Covid in Dec 2021 and intake has been reduced since.  He lost ~40 lbs in 7 months.  Patient is at risk for refeeding.  Glucose / Insulin: no hx DM - AM glucose low normal Electrolytes: low Na/CL, K 2.9 (goal >/= 4), Mag 1.9 (goal >/= 2), others WNL Renal: SCr < 1, BUN WNL Hepatic: LFTs / tbili WNL on admit 7/4 Intake / Output; MIVF: no UOP documented, NG 9/4, JP drain 70mL, stool, 31m, NS at 50 ml/hr GI Imaging: none since TPN GI Surgeries / Procedures: none since TPN  Central access: PICC placed 09/05/20 TPN start date: 09/05/20  Nutritional Goals (per RD rec on 7/13): 2150-2350 kCal, 100-115g AA, >2L fluid per day  Current Nutrition:  NPO  Plan:  Start TPN at 36mL/hr at 1800 (goal rate 85 ml/hr) TPN will provide 37g AA, 115g CHO and 23g ILE for a total of 770 kCal, meeting ~35% of patient needs Electrolytes in TPN: Na 185mEq/L, K 21mEq/L, Ca 75mEq/L, Mg 68mEq/L, Phos 16mmol/L, max CL for now Add standard MVI and trace elements to TPN Add thiamine and folate to TPN given hx EtOH use - plan for 7 days through 7/19 Initiate sensitive SSI Q6H KCL x 6 runs Mag sulfate 1gm IV x 1 Reduce NS to 20 mL/hr at 1800 Standard TPN labs and nursing care orders Surgery planning to stop Zosyn in  AM  Aleksei Goodlin D. 8/19, PharmD, BCPS, BCCCP 09/05/2020, 11:16 AM

## 2020-09-05 NOTE — Progress Notes (Signed)
Initial Nutrition Assessment  DOCUMENTATION CODES:  Severe malnutrition in context of chronic illness  INTERVENTION:  Start TPN per Pharmacy.  Advance diet as medically able and as tolerated.  Monitor magnesium, potassium, and phosphorus daily for at least 3 days, MD to replete as needed, as pt is at risk for refeeding syndrome given severe malnutrition diagnosis.   NUTRITION DIAGNOSIS:  Severe Malnutrition related to chronic illness as evidenced by severe muscle depletion, energy intake < or equal to 75% for > or equal to 1 month, percent weight loss.  GOAL:  Patient will meet greater than or equal to 90% of their needs  MONITOR:  I & O's, Skin, Diet advancement, Labs, Weight trends  REASON FOR ASSESSMENT:  Consult New TPN/TNA  ASSESSMENT:  50 yo male with a PMH of EtOH abuse and HTN who presents with colonic obstruction. 7/6 - Exploratory laparotomy, sigmoid colectomy with end colostomy for LBO 2/2 mass/abscess surrounding the sigmoid colon 7/11 - emesis (green) x1 7/12 - CT showed ileus  Spoke with pt at bedside. Pt reports that his appetite has been poor since last Christmas. He reports that he never lost his taste or smell, but his appetite never quite came back after having COVID-19 in December 2021.   Pt reports a 40 lb weight loss since 01/2020. No weight history in Epic. Per Care Everywhere, pt weighed 81.6 kg on 03/21/20. Pt currently weighs 74.8 kg. This is a ~15 lbs (8.1%) in the past 6.5 months, which is significant and severe for the time frame.  On exam, pt with moderate to severe depletions.  Given the above information, pt is severely malnourished in the chronic setting.  Pt is a refeeding risk given the above information as well. Replete K, Mag, and Phos as needed and monitor labs.  Recommend TPN per Pharmacy given inability to tolerate PO and EN not appropriate for obstruction. Recommend advancing diet as medically able and as tolerated to begin PO  again.  Medications: reviewed; Protonix, NaCl @ 50 ml/hr via IV, Zosyn TID via IV, Dilaudid PRN (given once today), oxycodone PRN (given once today)  Labs: reviewed  NUTRITION - FOCUSED PHYSICAL EXAM: Flowsheet Row Most Recent Value  Orbital Region Mild depletion  Upper Arm Region Moderate depletion  Thoracic and Lumbar Region Mild depletion  Buccal Region Moderate depletion  Temple Region Moderate depletion  Clavicle Bone Region Severe depletion  Clavicle and Acromion Bone Region Severe depletion  Scapular Bone Region Severe depletion  Dorsal Hand Mild depletion  Patellar Region Severe depletion  Anterior Thigh Region Severe depletion  Posterior Calf Region Severe depletion  Edema (RD Assessment) None  Hair Reviewed  Eyes Reviewed  Mouth Reviewed  Skin Reviewed  Nails Reviewed   Diet Order:   Diet Order             Diet NPO time specified Except for: Ice Chips, Sips with Meds  Diet effective now                  EDUCATION NEEDS:  Education needs have been addressed  Skin:  Skin Assessment: Skin Integrity Issues: Skin Integrity Issues:: Incisions Incisions: Abdomen, closed  Last BM:  09/04/20 - colostomy (200 ml output)  Height:  Ht Readings from Last 1 Encounters:  08/27/20 5\' 8"  (1.727 m)   Weight:  Wt Readings from Last 1 Encounters:  08/27/20 74.8 kg   BMI:  Body mass index is 25.09 kg/m.  Estimated Nutritional Needs:  Kcal:  2150-2350 Protein:  100-115 grams Fluid:  >2 L  Vertell Limber, RD, LDN (she/her/hers) Registered Dietitian I After-Hours/Weekend Pager # in [place]

## 2020-09-05 NOTE — Progress Notes (Signed)
Per PA michael Maczes nurse clamp the tube and let pt ambulate

## 2020-09-06 LAB — DIFFERENTIAL
Abs Immature Granulocytes: 0.22 10*3/uL — ABNORMAL HIGH (ref 0.00–0.07)
Basophils Absolute: 0.1 10*3/uL (ref 0.0–0.1)
Basophils Relative: 0 %
Eosinophils Absolute: 0.2 10*3/uL (ref 0.0–0.5)
Eosinophils Relative: 1 %
Immature Granulocytes: 2 %
Lymphocytes Relative: 17 %
Lymphs Abs: 2.4 10*3/uL (ref 0.7–4.0)
Monocytes Absolute: 1.6 10*3/uL — ABNORMAL HIGH (ref 0.1–1.0)
Monocytes Relative: 11 %
Neutro Abs: 9.9 10*3/uL — ABNORMAL HIGH (ref 1.7–7.7)
Neutrophils Relative %: 69 %

## 2020-09-06 LAB — COMPREHENSIVE METABOLIC PANEL
ALT: 16 U/L (ref 0–44)
AST: 17 U/L (ref 15–41)
Albumin: 2.4 g/dL — ABNORMAL LOW (ref 3.5–5.0)
Alkaline Phosphatase: 98 U/L (ref 38–126)
Anion gap: 7 (ref 5–15)
BUN: 12 mg/dL (ref 6–20)
CO2: 33 mmol/L — ABNORMAL HIGH (ref 22–32)
Calcium: 8.7 mg/dL — ABNORMAL LOW (ref 8.9–10.3)
Chloride: 97 mmol/L — ABNORMAL LOW (ref 98–111)
Creatinine, Ser: 0.76 mg/dL (ref 0.61–1.24)
GFR, Estimated: >60 mL/min (ref >60)
Glucose, Bld: 143 mg/dL — ABNORMAL HIGH (ref 70–99)
Potassium: 3 mmol/L — ABNORMAL LOW (ref 3.5–5.1)
Sodium: 137 mmol/L (ref 135–145)
Total Bilirubin: 0.8 mg/dL (ref 0.3–1.2)
Total Protein: 6.6 g/dL (ref 6.5–8.1)

## 2020-09-06 LAB — PHOSPHORUS: Phosphorus: 2.9 mg/dL (ref 2.5–4.6)

## 2020-09-06 LAB — CBC
HCT: 37.4 % — ABNORMAL LOW (ref 39.0–52.0)
Hemoglobin: 13 g/dL (ref 13.0–17.0)
MCH: 35.2 pg — ABNORMAL HIGH (ref 26.0–34.0)
MCHC: 34.8 g/dL (ref 30.0–36.0)
MCV: 101.4 fL — ABNORMAL HIGH (ref 80.0–100.0)
Platelets: 656 10*3/uL — ABNORMAL HIGH (ref 150–400)
RBC: 3.69 MIL/uL — ABNORMAL LOW (ref 4.22–5.81)
RDW: 14.4 % (ref 11.5–15.5)
WBC: 14.4 10*3/uL — ABNORMAL HIGH (ref 4.0–10.5)
nRBC: 0 % (ref 0.0–0.2)

## 2020-09-06 LAB — PREALBUMIN: Prealbumin: 11.8 mg/dL — ABNORMAL LOW (ref 18–38)

## 2020-09-06 LAB — GLUCOSE, CAPILLARY
Glucose-Capillary: 128 mg/dL — ABNORMAL HIGH (ref 70–99)
Glucose-Capillary: 133 mg/dL — ABNORMAL HIGH (ref 70–99)
Glucose-Capillary: 130 mg/dL — ABNORMAL HIGH (ref 70–99)

## 2020-09-06 LAB — TRIGLYCERIDES: Triglycerides: 190 mg/dL — ABNORMAL HIGH (ref <150)

## 2020-09-06 LAB — MAGNESIUM: Magnesium: 2.1 mg/dL (ref 1.7–2.4)

## 2020-09-06 MED ORDER — SODIUM CHLORIDE 0.9 % IV BOLUS
1000.0000 mL | Freq: Once | INTRAVENOUS | Status: AC
  Administered 2020-09-06: 1000 mL via INTRAVENOUS

## 2020-09-06 MED ORDER — SODIUM CHLORIDE 0.9 % IV SOLN: INTRAVENOUS | Status: DC

## 2020-09-06 MED ORDER — SILVER NITRATE-POT NITRATE 75-25 % EX MISC
1.0000 "application " | CUTANEOUS | Status: DC | PRN
  Filled 2020-09-06: qty 1

## 2020-09-06 MED ORDER — ACETAMINOPHEN 500 MG PO TABS
1000.0000 mg | ORAL_TABLET | Freq: Four times a day (QID) | ORAL | Status: DC | PRN
  Administered 2020-09-08 – 2020-09-17 (×4): 1000 mg via ORAL
  Filled 2020-09-06 (×5): qty 2

## 2020-09-06 MED ORDER — POTASSIUM CHLORIDE 10 MEQ/100ML IV SOLN
10.0000 meq | INTRAVENOUS | Status: AC
  Administered 2020-09-06 (×5): 10 meq via INTRAVENOUS
  Filled 2020-09-06 (×5): qty 100

## 2020-09-06 MED ORDER — POTASSIUM CHLORIDE 10 MEQ/100ML IV SOLN
10.0000 meq | Freq: Once | INTRAVENOUS | Status: AC
  Administered 2020-09-06: 10 meq via INTRAVENOUS
  Filled 2020-09-06: qty 100

## 2020-09-06 MED ORDER — LABETALOL HCL 5 MG/ML IV SOLN: 10.0000 mg | INTRAVENOUS | Status: DC | PRN

## 2020-09-06 MED ORDER — TRAVASOL 10 % IV SOLN
INTRAVENOUS | Status: AC
  Filled 2020-09-06: qty 741.6

## 2020-09-06 MED ORDER — MENTHOL 3 MG MT LOZG
1.0000 | LOZENGE | OROMUCOSAL | Status: DC | PRN
  Administered 2020-09-12 – 2020-09-15 (×3): 3 mg via ORAL
  Filled 2020-09-06 (×6): qty 9

## 2020-09-06 NOTE — TOC Progression Note (Signed)
Transition of Care [facility]) - Progression Note    Patient Details  Name: Ryan Velasquez MRN: [id number] Date of Birth: 08-14-1970  Transition of [facility]) CM/SW Contact  Hyland Mollenkopf, Adria Devon, RN Phone Number: 09/06/2020, 3:16 PM  Clinical Narrative:     Provided Sarah  at [facility]  [phone] ext [phone] , update   Expected Discharge Plan: Home w Home Health Services    Expected Discharge Plan and Services Expected Discharge Plan: Home w Home Health Services   Discharge Planning Services: CM Consult Post Acute Care Choice: Home Health Living arrangements for the past 2 months: Single Family Home                   DME Agency: NA       HH Arranged: RN           Social Determinants of Health (SDOH) Interventions    Readmission Risk Interventions No flowsheet data found.

## 2020-09-06 NOTE — Progress Notes (Signed)
   09/06/20 1258  Assess: MEWS Score  BP (!) 146/86  Pulse Rate (!) 118  SpO2 98 %  O2 Device Room Air  Assess: MEWS Score  MEWS Temp 0  MEWS Systolic 0  MEWS Pulse 2  MEWS RR 0  MEWS LOC 0  MEWS Score 2  MEWS Score Color Yellow  Treat  MEWS Interventions Escalated (See documentation below)  Pain Scale 0-10  Pain Score 5  Take Vital Signs  Increase Vital Sign Frequency  Yellow: Q 2hr X 2 then Q 4hr X 2, if remains yellow, continue Q 4hrs  Escalate  MEWS: Escalate Yellow: discuss with charge nurse/RN and consider discussing with provider and RRT  Notify: Charge Nurse/RN  Name of Charge Nurse/RN Notified Park Meo RN  Date Charge Nurse/RN Notified 09/06/20  Time Charge Nurse/RN Notified 1326  Notify: Provider  Provider Name/Title Carl Best PA  Date Provider Notified 09/06/20  Time Provider Notified 1326  Notification Type Page  Notification Reason Change in status  Provider response See new orders  Date of Provider Response 09/06/20  Time of Provider Response 1328  Document  Progress note created (see row info) Yes

## 2020-09-06 NOTE — Consult Note (Signed)
WOC Nurse ostomy follow up Patient receiving care in [facility] 6N02. Stoma type/location: LUQ colostomy Stomal assessment/size: as previous documented on 7/11. Peristomal assessment: intact Treatment options for stomal/peristomal skin: barrier ring Output: few drops in existing pouch  Ostomy pouching: 1pc. Flex convex [place] #[id number] Education provided: Today the patient did the entire pouch change process independently.  At the completion I asked the patient if he needed more WOC teaching sessions, to which he responded he should be fine.  I explained that if anything changed, he can always ask his nurse to contact us for him. Enrolled patient in [place] Secure Start Discharge program: Yes  Six additional convex pouches and barrier rings are being ordered by the [place] for placement in his room.  I instructed him to take home all ostomy care products in his room when he is discharged.  Helmut Muster, RN, MSN, CWOCN, CNS-BC, pager [phone]

## 2020-09-06 NOTE — Progress Notes (Signed)
Bleeding in the incisional wound is noted. Wound is cleaned and packed with W-D dressing.

## 2020-09-06 NOTE — Progress Notes (Signed)
PHARMACY - TOTAL PARENTERAL NUTRITION CONSULT NOTE  Indication: Prolonged ileus  Patient Measurements: Height: 5\' 8"  (172.7 cm) Weight: 74 kg (163 lb 2.3 oz) IBW/kg (Calculated) : 68.4 TPN AdjBW (KG): 74.8 Body mass index is 24.81 kg/m. Usual Weight: 93 kg  Assessment:  [name] presented on 7/4 with nausea, vomiting and abdominal pain for 2 days following constipation.  Found to have colonic obstruction with mass and abscess requiring ex-lap with sigmoid colectomy and end colostomy on 7/6.  Patient has nausea and vomiting post-op and was unable to start a diet.  CT on 7/12 showed post-op ileus.  Pharmacy consulted for TPN management given inadequate intake for ~12 days.  Patient reports having Covid in Dec 2021 and intake has been reduced since.  He lost ~40 lbs in 7 months.  Patient is at risk for refeeding.  Glucose / Insulin: no hx DM - BG < 150, used 3 units insulin Electrolytes: Cl 97 up, Co2 33, K 3 (K IV 60 mEq, goal >/= 4), Mag 2.1 (1g x1 7/13, goal >/= 2), CoCa 9.98, others WNL Renal: SCr 0.7, BUN WNL Hepatic: LFTs / tbili WNL, prealbumin 11.8, albumin 2.4, TG 190 on TPN   Intake / Output; MIVF: UOP incompletely documented, NG 8/13, JP drain 53mL, stool 79m, NS at 20 ml/hr GI Imaging:  7/12 CT abd - post-op ileus  7/12 Xray -gastric distention  GI Surgeries / Procedures:  7/6 ex-lap with sigmoid colectomy and end colostomy  Central access: PICC placed 09/05/20 TPN start date: 09/05/20  Nutritional Goals (per RD rec on 7/13): 2150-2350 kCal, 100-115g AA, >2L fluid per day  Current Nutrition:  NPO  Plan:  Increase TPN to 97mL/hr at 1800 (goal rate 85 ml/hr) TPN will provide 74g AA, 230g CHO and 46g ILE for a total of 1540 kCal, meeting ~70% of patient needs Electrolytes in TPN: Continue Na 155mEq/L, K 62mEq/L, Phos 17mmol/L,max CL; decrease Ca to 34mEq/L; Increase Mg 20mEq/L Add standard MVI and trace elements to TPN Add thiamine and folate to TPN given hx EtOH use -  plan for 7 days through 7/19 Continue sensitive SSI Q6H, adjust as needed  KCL x 6 runs Reduce NS to 10 mL/hr at 1800 TPN labs until stable at goal then Mon/Thurs    8/19, PharmD, BCPS, [facility] Clinical Pharmacist  Please check AMION for all [facility] Pharmacy phone numbers After 10:00 PM, call Main Pharmacy [phone]

## 2020-09-06 NOTE — Progress Notes (Signed)
Progress Note  8 Days Post-Op  Subjective: CC: eating 4-5 cups of ice a day. No nausea or emesis and abdominal pain well controlled. Having small amount of gas and stool output. Ambulating. Has had some bleeding from abdominal wound since dressing change this am   Objective: Vital signs in last 24 hours: Temp:  [97.5 F (36.4 C)-98.3 F (36.8 C)] 97.5 F (36.4 C) (07/14 0450) Pulse Rate:  [70-110] 105 (07/14 0450) Resp:  [14-18] 15 (07/14 0450) BP: (119-154)/(76-96) 150/91 (07/14 0450) SpO2:  [95 %-100 %] 96 % (07/14 0450) Weight:  [74 kg] 74 kg (07/13 1113) Last BM Date: 09/05/20  Intake/Output from previous day: 07/13 0701 - 07/14 0700 In: 1755.7 [P.O.:960; I.V.:595.7; IV Piggyback:200] Out: 5220 [Urine:500; Emesis/NG output:4500; Drains:70; Stool:150] Intake/Output this shift: Total I/O In: -  Out: 400 [Drains:400]  PE: General: pleasant, WD, male who is sitting up in chair in NAD HEENT: head is normocephalic, atraumatic. Mouth is pink and moist Heart: regular, rate, and rhythm.  Palpable radial pulses bilaterally Lungs: Respiratory effort nonlabored Abd: soft, hypoactive BS, very mild distension (significant improvement from 2 days ago). JP with scant serous output. Colostomy with scant liquid stool and no gas. Incision with slow oozing bleed from right lateral border - see below MS: all 4 extremities are symmetrical with no cyanosis, clubbing, or edema. Skin: warm and dry with no masses, lesions, or rashes Psych: A&Ox3 with an appropriate affect.       Lab Results:  Recent Labs    09/05/20 0311 09/06/20 0430  WBC 20.0* 14.4*  HGB 12.6* 13.0  HCT 37.4* 37.4*  PLT 659* 656*   BMET Recent Labs    09/05/20 0832 09/06/20 0430  NA 133* 137  K 2.9* 3.0*  CL 90* 97*  CO2 29 33*  GLUCOSE 95 143*  BUN 11 12  CREATININE 0.97 0.76  CALCIUM 8.5* 8.7*   PT/INR No results for input(s): LABPROT, INR in the last 72 hours. CMP     Component Value  Date/Time   NA 137 09/06/2020 0430   K 3.0 (L) 09/06/2020 0430   CL 97 (L) 09/06/2020 0430   CO2 33 (H) 09/06/2020 0430   GLUCOSE 143 (H) 09/06/2020 0430   BUN 12 09/06/2020 0430   CREATININE 0.76 09/06/2020 0430   CALCIUM 8.7 (L) 09/06/2020 0430   PROT 6.6 09/06/2020 0430   ALBUMIN 2.4 (L) 09/06/2020 0430   AST 17 09/06/2020 0430   ALT 16 09/06/2020 0430   ALKPHOS 98 09/06/2020 0430   BILITOT 0.8 09/06/2020 0430   GFRNONAA >60 09/06/2020 0430   Lipase     Component Value Date/Time   LIPASE 25 08/27/2020 1125       Studies/Results: CT ABDOMEN PELVIS W CONTRAST  Result Date: 09/04/2020 CLINICAL DATA:  Postoperative leukocytosis and ileus, concern for postoperative leak or abscess. EXAM: CT ABDOMEN AND PELVIS WITH CONTRAST TECHNIQUE: Multidetector CT imaging of the abdomen and pelvis was performed using the standard protocol following bolus administration of intravenous contrast. CONTRAST:  OMNIPAQUE IOHEXOL 300 MG/ML  SOLN COMPARISON:  CT August 27, 2020. FINDINGS: Lower chest: Patchy right greater than left nodular ground-glass opacities and consolidations with more confluent consolidations in the right lower lobe. No pleural effusion. Hepatobiliary: Diffuse hepatic steatosis. Subcentimeter cyst in the anterior right lobe of the liver on image 13/3. Gallbladder is unremarkable. No biliary ductal dilation. Pancreas: Within normal limits. Spleen: Within normal limits. Adrenals/Urinary Tract: Adrenal glands are unremarkable. Kidneys are normal, without  renal calculi, solid enhancing lesion, or hydronephrosis. Bladder is unremarkable for degree of distension. Stomach/Bowel: Postsurgical changes of partial colectomy with left anterior abdominal wall colostomy formation. Stomach is dilated with gas and ingested contents. There is diffuse dilation of gas and fluid-filled small bowel measuring up to 4.9 cm. Appendix is unremarkable. Gas and fluid levels throughout distended ascending and  transverse colon with relative normal caliber colon extending from the hepatic flexure to the left anterior abdominal wall colostomy site which contains a small volume of stool and gas. Vascular/Lymphatic: Aortic atherosclerosis without aneurysmal dilation. No pathologically enlarged abdominal or pelvic lymph nodes. Reproductive: Prostate is unremarkable. Other: Small volume ascites. Non approximated ventral anterior abdominal wall incision. Mild diffuse body wall edema. Right anterior abdominal approach surgical drain coiled in the pelvis. Musculoskeletal: No acute osseous abnormality. IMPRESSION: 1. Postsurgical changes of partial colectomy with left anterior abdominal wall colostomy. 2. Diffuse dilation of gas and fluid-filled small bowel measuring up to 4.9 cm. Gas and fluid levels throughout distended ascending and transverse colon with relative normal caliber colon extending from the hepatic flexure to the left anterior abdominal wall colostomy site which contains a small volume of stool and gas. Findings are favored to represent postoperative ileus. Continued radiographic follow-up to resolution is suggested. 3. Small volume ascites. No drainable fluid collections identified and no findings to suggest bowel perforation. 4. Patchy right greater than left nodular ground-glass opacities and consolidations with more confluent consolidations in the right lower lobe. Findings are concerning for multifocal pneumonia. 5. Diffuse hepatic steatosis. 6.  Aortic Atherosclerosis (ICD10-I70.0). Electronically Signed   By: Maudry Mayhew MD   On: 09/04/2020 16:11   DG Abd Portable 1V  Result Date: 09/04/2020 CLINICAL DATA:  Check gastric catheter placement EXAM: PORTABLE ABDOMEN - 1 VIEW COMPARISON:  08/30/20 FINDINGS: Gastric catheter is noted within the stomach. Small bowel dilatation is seen. IMPRESSION: Gastric catheter within the stomach. Persistent dilatation of the small bowel. Electronically Signed   By: Alcide Clever M.D.   On: 09/04/2020 21:40   [place] EKG SITE RITE  Result Date: 09/05/2020 If Site Rite image not attached, placement could not be confirmed due to current cardiac rhythm.   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 Fredricka Bonine 08/29/20 - Pathology without malignancy - CT 7/12 w/ ileus. No IAA - has been on zosyn since 7/4. Afebrile, leukocytosis markedly improved. D/c today - Cont NPO, NGT, PICC/TPN. NGT output 4500 mL - JP drain output serous - 70 mL yesterday. So far 400 mL today? Will confirm with RN.  Continue drain - BID WTD for midline. Likely venous bleeding - repacked and pressure dressing placed. monitor - Mobilize - Pulm toilet - WOC following for new ostomy teaching. He reports his sister will help at discharge - hypokalemia - replete IV   FEN: NPO, NGT ID: zosyn 7/4>7/14 VTE: SCDs, Lovenox Foley: removed 7/7, voiding   Tobacco  use - nicotine patch    LOS: 10 days    Eric Form, [facility] Surgery 09/06/2020, 8:51 AM Please see Amion for pager number during day hours 7:00am-4:30pm

## 2020-09-07 ENCOUNTER — Inpatient Hospital Stay (HOSPITAL_COMMUNITY): Payer: BC Managed Care – PPO

## 2020-09-07 LAB — BASIC METABOLIC PANEL
Anion gap: 7 (ref 5–15)
BUN: 13 mg/dL (ref 6–20)
CO2: 27 mmol/L (ref 22–32)
Calcium: 8.6 mg/dL — ABNORMAL LOW (ref 8.9–10.3)
Chloride: 102 mmol/L (ref 98–111)
Creatinine, Ser: 0.61 mg/dL (ref 0.61–1.24)
GFR, Estimated: >60 mL/min (ref >60)
Glucose, Bld: 112 mg/dL — ABNORMAL HIGH (ref 70–99)
Potassium: 3.1 mmol/L — ABNORMAL LOW (ref 3.5–5.1)
Sodium: 136 mmol/L (ref 135–145)

## 2020-09-07 LAB — GLUCOSE, CAPILLARY
Glucose-Capillary: 106 mg/dL — ABNORMAL HIGH (ref 70–99)
Glucose-Capillary: 106 mg/dL — ABNORMAL HIGH (ref 70–99)
Glucose-Capillary: 109 mg/dL — ABNORMAL HIGH (ref 70–99)
Glucose-Capillary: 130 mg/dL — ABNORMAL HIGH (ref 70–99)

## 2020-09-07 LAB — CBC
HCT: 31.8 % — ABNORMAL LOW (ref 39.0–52.0)
Hemoglobin: 10.9 g/dL — ABNORMAL LOW (ref 13.0–17.0)
MCH: 35.6 pg — ABNORMAL HIGH (ref 26.0–34.0)
MCHC: 34.3 g/dL (ref 30.0–36.0)
MCV: 103.9 fL — ABNORMAL HIGH (ref 80.0–100.0)
Platelets: 567 10*3/uL — ABNORMAL HIGH (ref 150–400)
RBC: 3.06 MIL/uL — ABNORMAL LOW (ref 4.22–5.81)
RDW: 14.6 % (ref 11.5–15.5)
WBC: 14.9 10*3/uL — ABNORMAL HIGH (ref 4.0–10.5)
nRBC: 0 % (ref 0.0–0.2)

## 2020-09-07 LAB — MAGNESIUM: Magnesium: 2.1 mg/dL (ref 1.7–2.4)

## 2020-09-07 LAB — PHOSPHORUS: Phosphorus: 2.8 mg/dL (ref 2.5–4.6)

## 2020-09-07 MED ORDER — PHENOL 1.4 % MT LIQD
1.0000 | OROMUCOSAL | Status: DC | PRN
  Administered 2020-09-07: 1 via OROMUCOSAL
  Filled 2020-09-07: qty 177

## 2020-09-07 MED ORDER — POTASSIUM CHLORIDE 10 MEQ/100ML IV SOLN
INTRAVENOUS | Status: AC
  Filled 2020-09-07: qty 100

## 2020-09-07 MED ORDER — TRAVASOL 10 % IV SOLN
INTRAVENOUS | Status: AC
  Filled 2020-09-07: qty 1050.6

## 2020-09-07 MED ORDER — POTASSIUM CHLORIDE 10 MEQ/50ML IV SOLN
10.0000 meq | INTRAVENOUS | Status: AC
  Administered 2020-09-07 (×5): 10 meq via INTRAVENOUS
  Filled 2020-09-07 (×6): qty 50

## 2020-09-07 MED ORDER — POTASSIUM PHOSPHATES 15 MMOLE/5ML IV SOLN
10.0000 mmol | Freq: Once | INTRAVENOUS | Status: AC
  Administered 2020-09-07: 10 mmol via INTRAVENOUS
  Filled 2020-09-07: qty 3.33

## 2020-09-07 NOTE — Progress Notes (Signed)
PHARMACY - TOTAL PARENTERAL NUTRITION CONSULT NOTE  Indication: Prolonged ileus  Patient Measurements: Height: 5\' 8"  (172.7 cm) Weight: 74 kg (163 lb 2.3 oz) IBW/kg (Calculated) : 68.4 TPN AdjBW (KG): 74.8 Body mass index is 24.81 kg/m. Usual Weight: 93 kg  Assessment:  [name] presented on 7/4 with nausea, vomiting and abdominal pain for 2 days following constipation.  Found to have colonic obstruction with mass and abscess requiring ex-lap with sigmoid colectomy and end colostomy on 7/6.  Patient has nausea and vomiting post-op and was unable to start a diet.  CT on 7/12 showed post-op ileus.  Pharmacy consulted for TPN management given inadequate intake for ~12 days.  Patient reports having Covid in Dec 2021 and intake has been reduced since.  He lost ~40 lbs in 7 months.  Patient is at risk for refeeding.  Glucose / Insulin: no hx DM - BG < 150, used 5 units insulin Electrolytes: mild refeeding - K 3.1 post 6 runs, others WNL (Phos low normal) Renal: SCr < 1, BUN WNL Hepatic: LFTs / tbili WNL, prealbumin 11.8, albumin 2.4, TG 190 on TPN   Intake / Output; MIVF: UOP incompletely documented, NG Jan 2022, JP drain , NS at 75 ml/hr GI Imaging:  7/12 CT abd - post-op ileus  7/12 Xray - gastric distention  7/15 Abd XR - pending GI Surgeries / Procedures:  7/6 ex-lap with sigmoid colectomy and end colostomy  Central access: PICC placed 09/05/20 TPN start date: 09/05/20  Nutritional Goals (per RD rec on 7/13): 2150-2350 kCal, 100-115g AA, >2L fluid per day  Current Nutrition:  TPN  Plan:  Increase TPN to goal rate 85 ml/hr at 1800 TPN will provide 105g AA, 326g CHO and 65g ILE for a total of 2182 kCal, meeting 100% of patient needs Electrolytes in TPN: Na 132mEq/L, K 59mEq/L, Ca 86mEq/L, Mag 4.73mEq/L, Phos 74mmol/L, max CL - lytes adjusted with increased TPN rate Add standard MVI and trace elements to TPN Add thiamine and folate to TPN given hx EtOH use - plan for 7 days  through 7/19 Continue sensitive SSI Q6H Repeat KCL x 6 runs KPhos 10 mmol IV x 1 NS increased to 75 ml/hr per MD on 7/14 TPN labs until stable at goal then Mon/Thurs, watch TG  Adan Baehr D. 8/14, PharmD, BCPS, BCCCP 09/07/2020, 7:45 AM

## 2020-09-07 NOTE — Care Management (Signed)
Provided Maralyn Sago  at [facility]  [phone] ext [phone] , update

## 2020-09-07 NOTE — Progress Notes (Signed)
Progress Note  9 Days Post-Op  Subjective: CC: he ate 4 cups of ice yesterday with NG output of 2620 mL. 2 cups of ice so far this am. He does not have abdominal pain or nausea/emesis with this. Abdominal pain with movement only. Still with small amount of liquid stool output   Objective: Vital signs in last 24 hours: Temp:  [97.4 F (36.3 C)-98.8 F (37.1 C)] 97.4 F (36.3 C) (07/15 0812) Pulse Rate:  [99-118] 101 (07/15 0812) Resp:  [17-18] 18 (07/15 0812) BP: (130-159)/(74-97) 158/97 (07/15 0812) SpO2:  [96 %-100 %] 100 % (07/15 0812) Last BM Date: 09/05/20  Intake/Output from previous day: 07/14 0701 - 07/15 0700 In: 3488.4 [P.O.:920; I.V.:827.7; IV Piggyback:1740.7] Out: 2925 [Urine:305; Emesis/NG output:1420; Drains:1200] Intake/Output this shift: No intake/output data recorded.  PE: General: pleasant, WD, male who is sitting up in bed in NAD HEENT: head is normocephalic, atraumatic. Mouth is pink and moist Heart: regular, rate, and rhythm.  Palpable radial pulses bilaterally Lungs: Respiratory effort nonlabored Abd: soft, hypoactive BS, minimal distension. JP with scant serous output. Colostomy with scant liquid stool and no gas. Incision with dressing c/d/I - removed to inspect R lateral border which shows some coagulated blood but no active bleeding MS: all 4 extremities are symmetrical with no cyanosis, clubbing, or edema. Skin: warm and dry with no masses, lesions, or rashes Psych: A&Ox3 with an appropriate affect.    Lab Results:  Recent Labs    09/06/20 0430 09/07/20 0423  WBC 14.4* 14.9*  HGB 13.0 10.9*  HCT 37.4* 31.8*  PLT 656* 567*    BMET Recent Labs    09/06/20 0430 09/07/20 0423  NA 137 136  K 3.0* 3.1*  CL 97* 102  CO2 33* 27  GLUCOSE 143* 112*  BUN 12 13  CREATININE 0.76 0.61  CALCIUM 8.7* 8.6*    PT/INR No results for input(s): LABPROT, INR in the last 72 hours. CMP     Component Value Date/Time   NA 136 09/07/2020 0423    K 3.1 (L) 09/07/2020 0423   CL 102 09/07/2020 0423   CO2 27 09/07/2020 0423   GLUCOSE 112 (H) 09/07/2020 0423   BUN 13 09/07/2020 0423   CREATININE 0.61 09/07/2020 0423   CALCIUM 8.6 (L) 09/07/2020 0423   PROT 6.6 09/06/2020 0430   ALBUMIN 2.4 (L) 09/06/2020 0430   AST 17 09/06/2020 0430   ALT 16 09/06/2020 0430   ALKPHOS 98 09/06/2020 0430   BILITOT 0.8 09/06/2020 0430   GFRNONAA >60 09/07/2020 0423   Lipase     Component Value Date/Time   LIPASE 25 08/27/2020 1125       Studies/Results: [place] EKG SITE RITE  Result Date: 09/05/2020 If Site Rite image not attached, placement could not be confirmed due to current cardiac rhythm.   Anti-infectives: Anti-infectives (From admission, onward)    Start     Dose/Rate Route Frequency Ordered Stop   08/28/20 2315  piperacillin-tazobactam (ZOSYN) IVPB 3.375 g  Status:  Discontinued        3.375 g 12.5 mL/hr over 240 Minutes Intravenous Every 8 hours 08/27/20 2308 09/06/20 0956   08/27/20 1515  piperacillin-tazobactam (ZOSYN) IVPB 3.375 g        3.375 g 12.5 mL/hr over 240 Minutes Intravenous  Once 08/27/20 1507 08/28/20 1901        Assessment/Plan POD 9 s/p Exploratory laparotomy, 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 Fredricka Bonine  08/29/20 - Pathology without malignancy - CT 7/12 w/ ileus. No IAA - WBC 14.9 - continue to monitor - Cont NPO, NGT, PICC/TPN. NGT output 2620 mL - JP drain output serous and minimal - confirmed output amount with nursing and is less than - BID WTD for midline - Mobilize - Pulm toilet - WOC has seen and HH ordered. He has performed ostomy pouch changes independently. He reports his sister will help at discharge - hypokalemia - IV repletion has been ordered   FEN: NPO, NGT ID: zosyn 7/4>7/14 VTE: SCDs, Lovenox Foley: removed 7/7, voiding   Tobacco use - nicotine patch    LOS: 11 days    Eric Form, [facility]  Surgery 09/07/2020, 8:19 AM Please see Amion for pager number during day hours 7:00am-4:30pm

## 2020-09-08 ENCOUNTER — Inpatient Hospital Stay (HOSPITAL_COMMUNITY): Payer: BC Managed Care – PPO

## 2020-09-08 LAB — BASIC METABOLIC PANEL
Anion gap: 8 (ref 5–15)
BUN: 11 mg/dL (ref 6–20)
CO2: 22 mmol/L (ref 22–32)
Calcium: 8.5 mg/dL — ABNORMAL LOW (ref 8.9–10.3)
Chloride: 108 mmol/L (ref 98–111)
Creatinine, Ser: 0.56 mg/dL — ABNORMAL LOW (ref 0.61–1.24)
GFR, Estimated: >60 mL/min (ref >60)
Glucose, Bld: 114 mg/dL — ABNORMAL HIGH (ref 70–99)
Potassium: 3.4 mmol/L — ABNORMAL LOW (ref 3.5–5.1)
Sodium: 138 mmol/L (ref 135–145)

## 2020-09-08 LAB — GLUCOSE, CAPILLARY
Glucose-Capillary: 109 mg/dL — ABNORMAL HIGH (ref 70–99)
Glucose-Capillary: 120 mg/dL — ABNORMAL HIGH (ref 70–99)

## 2020-09-08 LAB — CBC
HCT: 30.7 % — ABNORMAL LOW (ref 39.0–52.0)
Hemoglobin: 10.2 g/dL — ABNORMAL LOW (ref 13.0–17.0)
MCH: 35.2 pg — ABNORMAL HIGH (ref 26.0–34.0)
MCHC: 33.2 g/dL (ref 30.0–36.0)
MCV: 105.9 fL — ABNORMAL HIGH (ref 80.0–100.0)
Platelets: 551 10*3/uL — ABNORMAL HIGH (ref 150–400)
RBC: 2.9 MIL/uL — ABNORMAL LOW (ref 4.22–5.81)
RDW: 14.9 % (ref 11.5–15.5)
WBC: 16.3 10*3/uL — ABNORMAL HIGH (ref 4.0–10.5)
nRBC: 0 % (ref 0.0–0.2)

## 2020-09-08 LAB — PHOSPHORUS: Phosphorus: 3.8 mg/dL (ref 2.5–4.6)

## 2020-09-08 LAB — MAGNESIUM: Magnesium: 1.8 mg/dL (ref 1.7–2.4)

## 2020-09-08 MED ORDER — TRAVASOL 10 % IV SOLN
INTRAVENOUS | Status: DC
  Filled 2020-09-08: qty 1050.6

## 2020-09-08 MED ORDER — POTASSIUM CHLORIDE 10 MEQ/50ML IV SOLN
10.0000 meq | INTRAVENOUS | Status: AC
  Administered 2020-09-08 (×6): 10 meq via INTRAVENOUS
  Filled 2020-09-08 (×6): qty 50

## 2020-09-08 MED ORDER — MAGNESIUM SULFATE 2 GM/50ML IV SOLN
2.0000 g | Freq: Once | INTRAVENOUS | Status: AC
  Administered 2020-09-08: 2 g via INTRAVENOUS
  Filled 2020-09-08: qty 50

## 2020-09-08 NOTE — Progress Notes (Signed)
PHARMACY - TOTAL PARENTERAL NUTRITION CONSULT NOTE  Indication: Prolonged ileus  Patient Measurements: Height: 5\' 8"  (172.7 cm) Weight: 74 kg (163 lb 2.3 oz) IBW/kg (Calculated) : 68.4 TPN AdjBW (KG): 74.8 Body mass index is 24.81 kg/m. Usual Weight: 93 kg  Assessment:  [name] presented on 7/4 with nausea, vomiting and abdominal pain for 2 days following constipation.  Found to have colonic obstruction with mass and abscess requiring ex-lap with sigmoid colectomy and end colostomy on 7/6.  Patient has nausea and vomiting post-op and was unable to start a diet.  CT on 7/12 showed post-op ileus.  Pharmacy consulted for TPN management given inadequate intake for ~12 days.  Patient reports having Covid in Dec 2021 and intake has been reduced since.  He lost ~40 lbs in 7 months.  Patient is at risk for refeeding.  Glucose / Insulin: no hx DM - BG < 150, used 1 unit insulin in past 24 hrs Electrolytes: significant NG losses - K 3.4 post Jan 2022 (goal >/= 4), Mag 1.8 (goal >/= 2), others WNL Renal: SCr < 1, BUN WNL Hepatic: LFTs / tbili WNL, prealbumin 11.8, albumin 2.4, TG 190 on TPN   Intake / Output; MIVF: UOP incompletely documented, NG , JP drain 91mL, NS at 75 ml/hr GI Imaging:  7/12 CT abd - post-op ileus  7/12 Xray - gastric distention  7/15 Abd XR - no change 7/16 Abx XR - pending GI Surgeries / Procedures:  7/6 ex-lap with sigmoid colectomy and end colostomy  Central access: PICC placed 09/05/20 TPN start date: 09/05/20  Nutritional Goals (per RD rec on 7/13): 2150-2350 kCal, 100-115g AA, >2L fluid per day  Current Nutrition:  TPN  Plan:  Continue TPN at goal rate 85 ml/hr to provide 105g AA, 326g CHO and 65g ILE for a total of 2182 kCal, meeting 100% of patient needs Electrolytes in TPN: Na 145mEq/L, increase K to 78mEq/L (= 121mEq/d), Ca 61mEq/L, increase Mag 60mEq/L, Phos 34mmol/L, max CL Add standard MVI and trace elements to TPN Add thiamine and folate to TPN given  hx EtOH use - plan for 7 days through 7/19 D/C SSI/CBG checks Repeat KCL x 6 runs Mag sulfate 2gm IV x 1 NS increased to 75 ml/hr per MD on 7/14 F/U AM labs, watch TG  Jarrod Mcenery D. 8/14, PharmD, BCPS, BCCCP 09/08/2020, 8:48 AM

## 2020-09-08 NOTE — Progress Notes (Signed)
Patient ID: Ryan Velasquez, male   DOB: 08/03/1970, 50 y.o.   MRN: [id number] [facility] Progress Note:   10 Days Post-Op  Subjective: Mental status is alert .  Complaints none. Objective: Vital signs in last 24 hours: Temp:  [97.8 F (36.6 C)-98.3 F (36.8 C)] 97.8 F (36.6 C) (07/16 0738) Pulse Rate:  [86-98] 86 (07/16 0738) Resp:  [16-18] 18 (07/16 0738) BP: (127-144)/(66-78) 144/73 (07/16 0738) SpO2:  [99 %-100 %] 99 % (07/16 0738)  Intake/Output from previous day: 07/15 0701 - 07/16 0700 In: 1151.4 [P.O.:60; I.V.:666.6; IV Piggyback:424.9] Out: 5210 [Emesis/NG output:5200; Drains:10] Intake/Output this shift: No intake/output data recorded.  Physical Exam: Work of breathing is ok.  NG in place--passing flatus in bag this am.  If this continues will remove NG and start clears  Lab Results:  Results for orders placed or performed during the hospital encounter of 08/27/20 (from the past 48 hour(s))  Glucose, capillary     Status: Abnormal   Collection Time: 09/06/20 12:21 PM  Result Value Ref Range   Glucose-Capillary 133 (H) 70 - 99 mg/dL    Comment: Glucose reference range applies only to samples taken after fasting for at least 8 hours.  Glucose, capillary     Status: Abnormal   Collection Time: 09/06/20  6:13 PM  Result Value Ref Range   Glucose-Capillary 130 (H) 70 - 99 mg/dL    Comment: Glucose reference range applies only to samples taken after fasting for at least 8 hours.  Glucose, capillary     Status: Abnormal   Collection Time: 09/07/20 12:19 AM  Result Value Ref Range   Glucose-Capillary 130 (H) 70 - 99 mg/dL    Comment: Glucose reference range applies only to samples taken after fasting for at least 8 hours.  Basic metabolic panel     Status: Abnormal   Collection Time: 09/07/20  4:23 AM  Result Value Ref Range   Sodium 136 135 - 145 mmol/L   Potassium 3.1 (L) 3.5 - 5.1 mmol/L   Chloride 102 98 - 111 mmol/L   CO2 27 22 - 32 mmol/L   Glucose, Bld  112 (H) 70 - 99 mg/dL    Comment: Glucose reference range applies only to samples taken after fasting for at least 8 hours.   BUN 13 6 - 20 mg/dL   Creatinine, Ser 9.24 0.61 - 1.24 mg/dL   Calcium 8.6 (L) 8.9 - 10.3 mg/dL   GFR, Estimated >26 >83 mL/min    Comment: (NOTE) Calculated using the CKD-EPI Creatinine Equation (2021)    Anion gap 7 5 - 15    Comment: Performed at [facility], [address] [place], [place] [address]  Magnesium     Status: None   Collection Time: 09/07/20  4:23 AM  Result Value Ref Range   Magnesium 2.1 1.7 - 2.4 mg/dL    Comment: Performed at [facility], [address], [place], [place] [address]  Phosphorus     Status: None   Collection Time: 09/07/20  4:23 AM  Result Value Ref Range   Phosphorus 2.8 2.5 - 4.6 mg/dL    Comment: Performed at [facility], [address] [place], [place] [address]  CBC     Status: Abnormal   Collection Time: 09/07/20  4:23 AM  Result Value Ref Range   WBC 14.9 (H) 4.0 - 10.5 K/uL   RBC 3.06 (L) 4.22 - 5.81 MIL/uL   Hemoglobin 10.9 (  L) 13.0 - 17.0 g/dL   HCT 46.2 (L) 70.3 - 50.0 %   MCV 103.9 (H) 80.0 - 100.0 fL   MCH 35.6 (H) 26.0 - 34.0 pg   MCHC 34.3 30.0 - 36.0 g/dL   RDW 93.8 18.2 - 99.3 %   Platelets 567 (H) 150 - 400 K/uL   nRBC 0.0 0.0 - 0.2 %    Comment: Performed at [facility] Lab, [address] [place], [place] [address]  Glucose, capillary     Status: Abnormal   Collection Time: 09/07/20  5:54 AM  Result Value Ref Range   Glucose-Capillary 106 (H) 70 - 99 mg/dL    Comment: Glucose reference range applies only to samples taken after fasting for at least 8 hours.  Glucose, capillary     Status: Abnormal   Collection Time: 09/07/20 12:17 PM  Result Value Ref Range   Glucose-Capillary 109 (H) 70 - 99 mg/dL    Comment: Glucose reference range applies only to samples taken after fasting for at least 8 hours.  Glucose, capillary     Status: Abnormal   Collection Time: 09/07/20   6:21 PM  Result Value Ref Range   Glucose-Capillary 106 (H) 70 - 99 mg/dL    Comment: Glucose reference range applies only to samples taken after fasting for at least 8 hours.  Glucose, capillary     Status: Abnormal   Collection Time: 09/08/20 12:19 AM  Result Value Ref Range   Glucose-Capillary 120 (H) 70 - 99 mg/dL    Comment: Glucose reference range applies only to samples taken after fasting for at least 8 hours.  Basic metabolic panel     Status: Abnormal   Collection Time: 09/08/20  4:10 AM  Result Value Ref Range   Sodium 138 135 - 145 mmol/L   Potassium 3.4 (L) 3.5 - 5.1 mmol/L   Chloride 108 98 - 111 mmol/L   CO2 22 22 - 32 mmol/L   Glucose, Bld 114 (H) 70 - 99 mg/dL    Comment: Glucose reference range applies only to samples taken after fasting for at least 8 hours.   BUN 11 6 - 20 mg/dL   Creatinine, Ser 7.89 (L) 0.61 - 1.24 mg/dL   Calcium 8.5 (L) 8.9 - 10.3 mg/dL   GFR, Estimated >38 >10 mL/min    Comment: (NOTE) Calculated using the CKD-EPI Creatinine Equation (2021)    Anion gap 8 5 - 15    Comment: Performed at [facility], [address] [place], [place] [address]  Magnesium     Status: None   Collection Time: 09/08/20  4:10 AM  Result Value Ref Range   Magnesium 1.8 1.7 - 2.4 mg/dL    Comment: Performed at [facility], [address], [place], [place] [address]  Phosphorus     Status: None   Collection Time: 09/08/20  4:10 AM  Result Value Ref Range   Phosphorus 3.8 2.5 - 4.6 mg/dL    Comment: Performed at [facility], [address] [place], [place] [address]  CBC     Status: Abnormal   Collection Time: 09/08/20  4:10 AM  Result Value Ref Range   WBC 16.3 (H) 4.0 - 10.5 K/uL   RBC 2.90 (L) 4.22 - 5.81 MIL/uL   Hemoglobin 10.2 (L) 13.0 - 17.0 g/dL   HCT 23.5 (L) 36.1 - 44.3 %   MCV 105.9 (H) 80.0 - 100.0 fL   MCH 35.2 (  H) 26.0 - 34.0 pg   MCHC 33.2 30.0 - 36.0 g/dL   RDW 25.0 53.9 - 76.7 %   Platelets 551 (H) 150 - 400 K/uL    nRBC 0.0 0.0 - 0.2 %    Comment: Performed at [facility], [address], [place], [place] [address]    Radiology/Results: DG Abd Portable 1V  Result Date: 09/07/2020 CLINICAL DATA:  Abdominal discomfort.  Ileus. EXAM: PORTABLE ABDOMEN - 1 VIEW COMPARISON:  Seen view of the abdomen 09/04/2020 and 08/30/2020. FINDINGS: NG tube is in place with the tip and side-port in the stomach. There is persistent gaseous distention of small bowel with loops measuring up to 5.2 cm, not markedly changed. Surgical drain in the pelvis noted. IMPRESSION: No change in gaseous distention of small bowel worrisome for obstruction. NG tube in good position. Electronically Signed   By: Drusilla Kanner M.D.   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 Daphine Deutscher, MD, [facility] [place] [facility] [phone] to reach the surgeon on call.    09/08/2020 9:19 AM

## 2020-09-09 LAB — CBC
HCT: 31.4 % — ABNORMAL LOW (ref 39.0–52.0)
Hemoglobin: 10 g/dL — ABNORMAL LOW (ref 13.0–17.0)
MCH: 34.2 pg — ABNORMAL HIGH (ref 26.0–34.0)
MCHC: 31.8 g/dL (ref 30.0–36.0)
MCV: 107.5 fL — ABNORMAL HIGH (ref 80.0–100.0)
Platelets: 591 10*3/uL — ABNORMAL HIGH (ref 150–400)
RBC: 2.92 MIL/uL — ABNORMAL LOW (ref 4.22–5.81)
RDW: 15.2 % (ref 11.5–15.5)
WBC: 18.3 10*3/uL — ABNORMAL HIGH (ref 4.0–10.5)
nRBC: 0 % (ref 0.0–0.2)

## 2020-09-09 LAB — BASIC METABOLIC PANEL
Anion gap: 5 (ref 5–15)
BUN: 13 mg/dL (ref 6–20)
CO2: 23 mmol/L (ref 22–32)
Calcium: 8.6 mg/dL — ABNORMAL LOW (ref 8.9–10.3)
Chloride: 112 mmol/L — ABNORMAL HIGH (ref 98–111)
Creatinine, Ser: 0.57 mg/dL — ABNORMAL LOW (ref 0.61–1.24)
GFR, Estimated: >60 mL/min (ref >60)
Glucose, Bld: 128 mg/dL — ABNORMAL HIGH (ref 70–99)
Potassium: 3.7 mmol/L (ref 3.5–5.1)
Sodium: 140 mmol/L (ref 135–145)

## 2020-09-09 LAB — MAGNESIUM: Magnesium: 2.2 mg/dL (ref 1.7–2.4)

## 2020-09-09 LAB — PHOSPHORUS: Phosphorus: 3.4 mg/dL (ref 2.5–4.6)

## 2020-09-09 MED ORDER — TRAVASOL 10 % IV SOLN: INTRAVENOUS | Status: AC

## 2020-09-09 MED ORDER — TRAVASOL 10 % IV SOLN
INTRAVENOUS | Status: AC
  Filled 2020-09-09: qty 1050.6

## 2020-09-09 NOTE — Progress Notes (Addendum)
PHARMACY - TOTAL PARENTERAL NUTRITION CONSULT NOTE  Indication: Prolonged ileus  Patient Measurements: Height: 5\' 8"  (172.7 cm) Weight: 74 kg (163 lb 2.3 oz) IBW/kg (Calculated) : 68.4 TPN AdjBW (KG): 74.8 Body mass index is 24.81 kg/m. Usual Weight: 93 kg  Assessment:  [name] presented on 7/4 with nausea, vomiting and abdominal pain for 2 days following constipation.  Found to have colonic obstruction with mass and abscess requiring ex-lap with sigmoid colectomy and end colostomy on 7/6.  Patient has nausea and vomiting post-op and was unable to start a diet.  CT on 7/12 showed post-op ileus.  Pharmacy consulted for TPN management given inadequate intake for ~12 days.  Patient reports having Covid in Dec 2021 and intake has been reduced since.  He lost ~40 lbs in 7 months.  Patient is at risk for refeeding.  Glucose / Insulin: no hx DM - CBGs < 150.  SSI/CBG checks D/C'ed 7/16 Electrolytes: significant NG losses - K 3.7 post 6 runs (goal >/= 4), CL elevated, others WNL Renal: SCr < 1, BUN WNL Hepatic: LFTs / tbili WNL, prealbumin 11.8, albumin 2.4, TG 190 on TPN   Intake / Output; MIVF: UOP incompletely documented, NG 8/16, JP drain 78mL, stool 31mL, NS at 75 ml/hr, net -15L Pertinent GI Imaging:  7/12 CT abd - post-op ileus  7/12 Xray - gastric distention  7/16 Abx XR - mildly progressive SB ileus or partial obstruction GI Surgeries / Procedures:  7/6 ex-lap with sigmoid colectomy and end colostomy  Central access: PICC placed 09/05/20 TPN start date: 09/05/20  Nutritional Goals (per RD rec on 7/13): 2150-2350 kCal, 100-115g AA, >2L fluid per day  Current Nutrition:  TPN  Plan:  Surgery removing NG tube, starting clears and planning to wean TPN Spoke to MD, okay to delay weaning TPN until tomorrow 7/18 Patient has been receiving 160-274mEq K per day for the past 4 days d/t refeeding and significant NG losses.  With NG tube being removed, will reduce K in TPN and  reassess.  Continue TPN at goal rate 85 ml/hr to provide 105g AA, 326g CHO and 65g ILE for a total of 2182 kCal, meeting 100% of patient needs Electrolytes in TPN: Na 132mEq/L, reduce K to 39mEq/L, Ca 71mEq/L, increase Mag 22mEq/L on 7/16, Phos 38mmol/L, change Cl:Ac 1:1 Add standard MVI and trace elements to TPN Add thiamine and folate to TPN given hx EtOH use - plan for 7 days through 7/19 NS 75 ml/hr per MD F/U AM labs, watch TG, PO intake/diet advancement  Rishith Siddoway D. 04-02-1982, PharmD, BCPS, BCCCP 09/09/2020, 9:11 AM

## 2020-09-09 NOTE — Progress Notes (Signed)
Patient ID: Ryan Velasquez, male   DOB: 1970-12-22, 50 y.o.   MRN: [id number] [facility] Progress Note:   11 Days Post-Op  Subjective: Mental status is clear.  Complaints none. Objective: Vital signs in last 24 hours: Temp:  [97.3 F (36.3 C)-98.2 F (36.8 C)] 97.5 F (36.4 C) (07/17 0433) Pulse Rate:  [91-102] 91 (07/17 0433) Resp:  [16-18] 18 (07/17 0433) BP: (147-156)/(73-85) 149/73 (07/17 0433) SpO2:  [100 %] 100 % (07/17 0433)  Intake/Output from previous day: 07/16 0701 - 07/17 0700 In: 1200 [I.V.:1000; IV Piggyback:200] Out: 9185 [Emesis/NG output:9150; Drains:20; Stool:15] Intake/Output this shift: No intake/output data recorded.  Physical Exam: Work of breathing is normal.  Passing flatus through ostomy  Lab Results:  Results for orders placed or performed during the hospital encounter of 08/27/20 (from the past 48 hour(s))  Glucose, capillary     Status: Abnormal   Collection Time: 09/07/20 12:17 PM  Result Value Ref Range   Glucose-Capillary 109 (H) 70 - 99 mg/dL    Comment: Glucose reference range applies only to samples taken after fasting for at least 8 hours.  Glucose, capillary     Status: Abnormal   Collection Time: 09/07/20  6:21 PM  Result Value Ref Range   Glucose-Capillary 106 (H) 70 - 99 mg/dL    Comment: Glucose reference range applies only to samples taken after fasting for at least 8 hours.  Glucose, capillary     Status: Abnormal   Collection Time: 09/08/20 12:19 AM  Result Value Ref Range   Glucose-Capillary 120 (H) 70 - 99 mg/dL    Comment: Glucose reference range applies only to samples taken after fasting for at least 8 hours.  Basic metabolic panel     Status: Abnormal   Collection Time: 09/08/20  4:10 AM  Result Value Ref Range   Sodium 138 135 - 145 mmol/L   Potassium 3.4 (L) 3.5 - 5.1 mmol/L   Chloride 108 98 - 111 mmol/L   CO2 22 22 - 32 mmol/L   Glucose, Bld 114 (H) 70 - 99 mg/dL    Comment: Glucose reference range applies  only to samples taken after fasting for at least 8 hours.   BUN 11 6 - 20 mg/dL   Creatinine, Ser 4.13 (L) 0.61 - 1.24 mg/dL   Calcium 8.5 (L) 8.9 - 10.3 mg/dL   GFR, Estimated >24 >40 mL/min    Comment: (NOTE) Calculated using the CKD-EPI Creatinine Equation (2021)    Anion gap 8 5 - 15    Comment: Performed at [facility], [address] [place], [place] [address]  Magnesium     Status: None   Collection Time: 09/08/20  4:10 AM  Result Value Ref Range   Magnesium 1.8 1.7 - 2.4 mg/dL    Comment: Performed at [facility], [address] [place], [place] [address]  Phosphorus     Status: None   Collection Time: 09/08/20  4:10 AM  Result Value Ref Range   Phosphorus 3.8 2.5 - 4.6 mg/dL    Comment: Performed at [facility], [address], [place], [place] [address]  CBC     Status: Abnormal   Collection Time: 09/08/20  4:10 AM  Result Value Ref Range   WBC 16.3 (H) 4.0 - 10.5 K/uL   RBC 2.90 (L) 4.22 - 5.81 MIL/uL   Hemoglobin 10.2 (L) 13.0 - 17.0 g/dL   HCT 42.5 (L) 95.6 - 38.7 %  MCV 105.9 (H) 80.0 - 100.0 fL   MCH 35.2 (H) 26.0 - 34.0 pg   MCHC 33.2 30.0 - 36.0 g/dL   RDW 73.4 19.3 - 79.0 %   Platelets 551 (H) 150 - 400 K/uL   nRBC 0.0 0.0 - 0.2 %    Comment: Performed at [facility], [address], [place], [place] [address]  Glucose, capillary     Status: Abnormal   Collection Time: 09/08/20 12:01 PM  Result Value Ref Range   Glucose-Capillary 109 (H) 70 - 99 mg/dL    Comment: Glucose reference range applies only to samples taken after fasting for at least 8 hours.  CBC     Status: Abnormal   Collection Time: 09/09/20  3:13 AM  Result Value Ref Range   WBC 18.3 (H) 4.0 - 10.5 K/uL   RBC 2.92 (L) 4.22 - 5.81 MIL/uL   Hemoglobin 10.0 (L) 13.0 - 17.0 g/dL   HCT 35.3 (L) 29.9 - 24.2 %   MCV 107.5 (H) 80.0 - 100.0 fL   MCH 34.2 (H) 26.0 - 34.0 pg   MCHC 31.8 30.0 - 36.0 g/dL   RDW 68.3 41.9 - 62.2 %   Platelets 591 (H) 150 - 400 K/uL    nRBC 0.0 0.0 - 0.2 %    Comment: Performed at [facility], [address], [place], [place] [address]  Basic metabolic panel     Status: Abnormal   Collection Time: 09/09/20  3:13 AM  Result Value Ref Range   Sodium 140 135 - 145 mmol/L   Potassium 3.7 3.5 - 5.1 mmol/L   Chloride 112 (H) 98 - 111 mmol/L   CO2 23 22 - 32 mmol/L   Glucose, Bld 128 (H) 70 - 99 mg/dL    Comment: Glucose reference range applies only to samples taken after fasting for at least 8 hours.   BUN 13 6 - 20 mg/dL   Creatinine, Ser 9.21 (L) 0.61 - 1.24 mg/dL   Calcium 8.6 (L) 8.9 - 10.3 mg/dL   GFR, Estimated >19 >41 mL/min    Comment: (NOTE) Calculated using the CKD-EPI Creatinine Equation (2021)    Anion gap 5 5 - 15    Comment: Performed at [facility], [address], [place], [place] [address]  Magnesium     Status: None   Collection Time: 09/09/20  3:13 AM  Result Value Ref Range   Magnesium 2.2 1.7 - 2.4 mg/dL    Comment: Performed at [facility], [address] [place], [place] [address]  Phosphorus     Status: None   Collection Time: 09/09/20  3:13 AM  Result Value Ref Range   Phosphorus 3.4 2.5 - 4.6 mg/dL    Comment: Performed at [facility], [address] [place], [place] [address]    Radiology/Results: DG Abd Portable 1V  Result Date: 09/08/2020 CLINICAL DATA:  Follow-up small bowel dilatation. Status post partial sigmoid colectomy and colostomy on 08/29/2020. EXAM: PORTABLE ABDOMEN - 1 VIEW COMPARISON:  09/07/2020 FINDINGS: The nasogastric tube tip and side hole remain in the proximal stomach. Mildly progressive dilatation of multiple loops of jejunum. A drainage tube in the pelvis is unchanged. Mild lower lumbar and lower thoracic spine degenerative changes. IMPRESSION: Mildly progressive small-bowel ileus or partial obstruction. Electronically Signed   By: Beckie Salts M.D.   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 Daphine Deutscher, MD, [facility] [phone] to reach the surgeon on call.    09/09/2020 8:52 AM

## 2020-09-10 ENCOUNTER — Inpatient Hospital Stay (HOSPITAL_COMMUNITY): Payer: BC Managed Care – PPO

## 2020-09-10 LAB — COMPREHENSIVE METABOLIC PANEL
ALT: 30 U/L (ref 0–44)
AST: 31 U/L (ref 15–41)
Albumin: 2.4 g/dL — ABNORMAL LOW (ref 3.5–5.0)
Alkaline Phosphatase: 82 U/L (ref 38–126)
Anion gap: 8 (ref 5–15)
BUN: 12 mg/dL (ref 6–20)
CO2: 24 mmol/L (ref 22–32)
Calcium: 8.7 mg/dL — ABNORMAL LOW (ref 8.9–10.3)
Chloride: 104 mmol/L (ref 98–111)
Creatinine, Ser: 0.48 mg/dL — ABNORMAL LOW (ref 0.61–1.24)
GFR, Estimated: >60 mL/min (ref >60)
Glucose, Bld: 106 mg/dL — ABNORMAL HIGH (ref 70–99)
Potassium: 3.8 mmol/L (ref 3.5–5.1)
Sodium: 136 mmol/L (ref 135–145)
Total Bilirubin: 0.4 mg/dL (ref 0.3–1.2)
Total Protein: 5.9 g/dL — ABNORMAL LOW (ref 6.5–8.1)

## 2020-09-10 LAB — CBC
HCT: 30.9 % — ABNORMAL LOW (ref 39.0–52.0)
Hemoglobin: 10.2 g/dL — ABNORMAL LOW (ref 13.0–17.0)
MCH: 35.2 pg — ABNORMAL HIGH (ref 26.0–34.0)
MCHC: 33 g/dL (ref 30.0–36.0)
MCV: 106.6 fL — ABNORMAL HIGH (ref 80.0–100.0)
Platelets: 648 10*3/uL — ABNORMAL HIGH (ref 150–400)
RBC: 2.9 MIL/uL — ABNORMAL LOW (ref 4.22–5.81)
RDW: 15.5 % (ref 11.5–15.5)
WBC: 22 10*3/uL — ABNORMAL HIGH (ref 4.0–10.5)
nRBC: 0 % (ref 0.0–0.2)

## 2020-09-10 LAB — DIFFERENTIAL
Abs Immature Granulocytes: 0.2 10*3/uL — ABNORMAL HIGH (ref 0.00–0.07)
Basophils Absolute: 0.1 10*3/uL (ref 0.0–0.1)
Basophils Relative: 0 %
Eosinophils Absolute: 0.7 10*3/uL — ABNORMAL HIGH (ref 0.0–0.5)
Eosinophils Relative: 3 %
Immature Granulocytes: 1 %
Lymphocytes Relative: 14 %
Lymphs Abs: 3 10*3/uL (ref 0.7–4.0)
Monocytes Absolute: 1.3 10*3/uL — ABNORMAL HIGH (ref 0.1–1.0)
Monocytes Relative: 6 %
Neutro Abs: 16.8 10*3/uL — ABNORMAL HIGH (ref 1.7–7.7)
Neutrophils Relative %: 76 %

## 2020-09-10 LAB — PREALBUMIN: Prealbumin: 13.5 mg/dL — ABNORMAL LOW (ref 18–38)

## 2020-09-10 LAB — TRIGLYCERIDES: Triglycerides: 147 mg/dL (ref <150)

## 2020-09-10 LAB — MAGNESIUM: Magnesium: 1.8 mg/dL (ref 1.7–2.4)

## 2020-09-10 LAB — PHOSPHORUS: Phosphorus: 3.4 mg/dL (ref 2.5–4.6)

## 2020-09-10 MED ORDER — ALUM & MAG HYDROXIDE-SIMETH 200-200-20 MG/5ML PO SUSP
30.0000 mL | ORAL | Status: DC | PRN
  Administered 2020-09-10: 30 mL via ORAL
  Filled 2020-09-10: qty 30

## 2020-09-10 MED ORDER — TRAVASOL 10 % IV SOLN
INTRAVENOUS | Status: AC
  Filled 2020-09-10: qty 556.2

## 2020-09-10 MED ORDER — IOHEXOL 9 MG/ML PO SOLN
ORAL | Status: AC
  Administered 2020-09-10: 500 mL
  Filled 2020-09-10: qty 1000

## 2020-09-10 MED ORDER — IOHEXOL 300 MG/ML  SOLN
100.0000 mL | Freq: Once | INTRAMUSCULAR | Status: AC | PRN
  Administered 2020-09-10: 100 mL via INTRAVENOUS

## 2020-09-10 MED ORDER — BOOST / RESOURCE BREEZE PO LIQD CUSTOM
1.0000 | Freq: Three times a day (TID) | ORAL | Status: DC
  Administered 2020-09-10: 1 via ORAL

## 2020-09-10 NOTE — Progress Notes (Signed)
Progress Note  12 Days Post-Op  Subjective: CC: NGT out and tolerating CLD yesterday.  States he can't tell if he is anymore bloated or not.  Colostomy is working.  Mobilizing some as well.  Denies any nausea.   Objective: Vital signs in last 24 hours: Temp:  [98.4 F (36.9 C)-98.7 F (37.1 C)] 98.7 F (37.1 C) (07/18 0546) Pulse Rate:  [90-110] 110 (07/18 0546) Resp:  [16-18] 18 (07/18 0546) BP: (131-132)/(76-81) 132/81 (07/18 0546) SpO2:  [99 %-100 %] 100 % (07/18 0546) Last BM Date: 09/05/20  Intake/Output from previous day: 07/17 0701 - 07/18 0700 In: 1200 [P.O.:1200] Out: 170 [Drains:20; Stool:150] Intake/Output this shift: Total I/O In: 300 [P.O.:300] Out: -   PE: Abd: soft, but with some distention, +BS, colostomy is viable with some feculent output, midline wound is overall very clean and packed.  JP drain with 20cc of serous fluid.   Lab Results:  Recent Labs    09/09/20 0313 09/10/20 0311  WBC 18.3* 22.0*  HGB 10.0* 10.2*  HCT 31.4* 30.9*  PLT 591* 648*   BMET Recent Labs    09/09/20 0313 09/10/20 0311  NA 140 136  K 3.7 3.8  CL 112* 104  CO2 23 24  GLUCOSE 128* 106*  BUN 13 12  CREATININE 0.57* 0.48*  CALCIUM 8.6* 8.7*   PT/INR No results for input(s): LABPROT, INR in the last 72 hours. CMP     Component Value Date/Time   NA 136 09/10/2020 0311   K 3.8 09/10/2020 0311   CL 104 09/10/2020 0311   CO2 24 09/10/2020 0311   GLUCOSE 106 (H) 09/10/2020 0311   BUN 12 09/10/2020 0311   CREATININE 0.48 (L) 09/10/2020 0311   CALCIUM 8.7 (L) 09/10/2020 0311   PROT 5.9 (L) 09/10/2020 0311   ALBUMIN 2.4 (L) 09/10/2020 0311   AST 31 09/10/2020 0311   ALT 30 09/10/2020 0311   ALKPHOS 82 09/10/2020 0311   BILITOT 0.4 09/10/2020 0311   GFRNONAA >60 09/10/2020 0311   Lipase     Component Value Date/Time   LIPASE 25 08/27/2020 1125       Studies/Results: No results found.  Anti-infectives: Anti-infectives (From admission, onward)     Start     Dose/Rate Route Frequency Ordered Stop   08/28/20 2315  piperacillin-tazobactam (ZOSYN) IVPB 3.375 g  Status:  Discontinued        3.375 g 12.5 mL/hr over 240 Minutes Intravenous Every 8 hours 08/27/20 2308 09/06/20 0956   08/27/20 1515  piperacillin-tazobactam (ZOSYN) IVPB 3.375 g        3.375 g 12.5 mL/hr over 240 Minutes Intravenous  Once 08/27/20 1507 08/28/20 1901        Assessment/Plan POD 12 s/p Exploratory laparotomy, 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 Fredricka Bonine 08/29/20 - Pathology without malignancy - CT 7/12 w/ ileus. No IAA - WBC up to 22K today and has been trending up over the last 4 days after initially falling.  Zosyn has been stopped about 4 days ago. - FLD and try some resource breeze - JP drain output serous and minimal 20cc/24hrs - BID WTD for midline - Mobilize - Pulm toilet - WOC has seen and HH ordered. He has performed ostomy pouch changes independently. He reports his sister will help at discharge - hypokalemia - IV repletion has been ordered -prealbumin up to 13.5   FEN: FLD, resource breeze, half TNA ID: [email or web address] 7/4>7/14 VTE:  SCDs, Lovenox Foley: removed 7/7, voiding   Tobacco use - nicotine patch    LOS: 14 days    Letha Cape, [facility] 09/10/2020, 9:31 AM Please see Amion for pager number during day hours 7:00am-4:30pm

## 2020-09-10 NOTE — Progress Notes (Signed)
PHARMACY - TOTAL PARENTERAL NUTRITION CONSULT NOTE  Indication: Prolonged ileus  Patient Measurements: Height: 5\' 8"  (172.7 cm) Weight: 74 kg (163 lb 2.3 oz) IBW/kg (Calculated) : 68.4 TPN AdjBW (KG): 74.8 Body mass index is 24.81 kg/m. Usual Weight: 93 kg  Assessment:  [name] presented on 7/4 with N/V and abdominal pain x 2 days following constipation.  Found to have colonic obstruction with mass and abscess requiring ex-lap with sigmoid colectomy and end colostomy on 7/6.  Patient has nausea and vomiting post-op and was unable to start a diet. CT on 7/12 showed post-op ileus.  Pharmacy consulted for TPN management given inadequate intake for ~12 days.  Patient reports having Covid in Dec 2021 and intake has been reduced since.  He lost ~40 lbs in 7 months.  Patient is at risk for refeeding.  Glucose / Insulin: no hx DM. SSI/CBG checks D/C'ed 7/16  Electrolytes: previous significant NG losses - K 3.8 (goal >/= 4), Mg 1.8 (goal >=2)  Renal: SCr < 1, BUN WNL  Hepatic: LFTs / tbili WNL, prealbumin 11.8>>13.5, albumin 2.4, TG 147 on TPN    Intake / Output; MIVF: UOP not documented, NG d/c'd, JP drain 46mL, stool 30m,  - NS at 75 ml/hr - po intake: CLD  Pertinent GI Imaging:  7/12 CT abd - post-op ileus  7/12 Xray - gastric distention  7/16 Abx XR - mildly progressive SB ileus or partial obstruction  GI Surgeries / Procedures:   7/6 ex-lap with sigmoid colectomy and end colostomy  Central access: PICC placed 09/05/20 TPN start date: 09/05/20  Nutritional Goals (per RD rec on 7/13): 2150-2350 kCal, 100-115g AA, >2L fluid per day  Current Nutrition:  TPN  Plan:  Full liquid diet, Resource Breeze Decrease TPN to 1/2 rate  Add standard MVI and trace elements to TPN Add thiamine and folate to TPN given hx EtOH use - plan for 7 days through 7/19    Ravinder Lukehart S. 8/19, PharmD, BCPS Clinical Staff Pharmacist [email or web address] 09/10/2020, 7:51 AM

## 2020-09-11 ENCOUNTER — Inpatient Hospital Stay (HOSPITAL_COMMUNITY): Payer: BC Managed Care – PPO

## 2020-09-11 DIAGNOSIS — D72829 Elevated white blood cell count, unspecified: Secondary | ICD-10-CM

## 2020-09-11 DIAGNOSIS — E43 Unspecified severe protein-calorie malnutrition: Secondary | ICD-10-CM | POA: Insufficient documentation

## 2020-09-11 LAB — CBC
HCT: 29.5 % — ABNORMAL LOW (ref 39.0–52.0)
Hemoglobin: 9.9 g/dL — ABNORMAL LOW (ref 13.0–17.0)
MCH: 34.9 pg — ABNORMAL HIGH (ref 26.0–34.0)
MCHC: 33.6 g/dL (ref 30.0–36.0)
MCV: 103.9 fL — ABNORMAL HIGH (ref 80.0–100.0)
Platelets: 576 10*3/uL — ABNORMAL HIGH (ref 150–400)
RBC: 2.84 MIL/uL — ABNORMAL LOW (ref 4.22–5.81)
RDW: 15 % (ref 11.5–15.5)
WBC: 24.2 10*3/uL — ABNORMAL HIGH (ref 4.0–10.5)
nRBC: 0 % (ref 0.0–0.2)

## 2020-09-11 LAB — BASIC METABOLIC PANEL
Anion gap: 8 (ref 5–15)
BUN: 8 mg/dL (ref 6–20)
CO2: 26 mmol/L (ref 22–32)
Calcium: 8.6 mg/dL — ABNORMAL LOW (ref 8.9–10.3)
Chloride: 96 mmol/L — ABNORMAL LOW (ref 98–111)
Creatinine, Ser: 0.55 mg/dL — ABNORMAL LOW (ref 0.61–1.24)
GFR, Estimated: >60 mL/min (ref >60)
Glucose, Bld: 116 mg/dL — ABNORMAL HIGH (ref 70–99)
Potassium: 2.8 mmol/L — ABNORMAL LOW (ref 3.5–5.1)
Sodium: 130 mmol/L — ABNORMAL LOW (ref 135–145)

## 2020-09-11 LAB — MAGNESIUM: Magnesium: 1.9 mg/dL (ref 1.7–2.4)

## 2020-09-11 LAB — URINALYSIS, ROUTINE W REFLEX MICROSCOPIC
Bilirubin Urine: NEGATIVE
Glucose, UA: NEGATIVE mg/dL
Hgb urine dipstick: NEGATIVE
Ketones, ur: NEGATIVE mg/dL
Leukocytes,Ua: NEGATIVE
Nitrite: NEGATIVE
Protein, ur: NEGATIVE mg/dL
Specific Gravity, Urine: 1.017 (ref 1.005–1.030)
pH: 6 (ref 5.0–8.0)

## 2020-09-11 MED ORDER — POTASSIUM CHLORIDE 10 MEQ/50ML IV SOLN
10.0000 meq | INTRAVENOUS | Status: AC
Start: 2020-09-11 — End: 2020-09-11
  Administered 2020-09-11 (×5): 10 meq via INTRAVENOUS
  Filled 2020-09-11 (×5): qty 50

## 2020-09-11 MED ORDER — GUAIFENESIN 100 MG/5ML PO SOLN
5.0000 mL | Freq: Four times a day (QID) | ORAL | Status: DC | PRN
  Filled 2020-09-11: qty 5

## 2020-09-11 MED ORDER — POTASSIUM CHLORIDE IN NACL 40-0.9 MEQ/L-% IV SOLN
INTRAVENOUS | Status: DC
  Filled 2020-09-11 (×6): qty 1000

## 2020-09-11 MED ORDER — METOPROLOL TARTRATE 5 MG/5ML IV SOLN
5.0000 mg | Freq: Four times a day (QID) | INTRAVENOUS | Status: DC
  Administered 2020-09-11 – 2020-09-19 (×30): 5 mg via INTRAVENOUS
  Filled 2020-09-11 (×32): qty 5

## 2020-09-11 MED ORDER — TRAVASOL 10 % IV SOLN
INTRAVENOUS | Status: AC
  Filled 2020-09-11: qty 1050.6

## 2020-09-11 MED ORDER — METOCLOPRAMIDE HCL 5 MG/ML IJ SOLN
5.0000 mg | Freq: Three times a day (TID) | INTRAMUSCULAR | Status: AC
  Administered 2020-09-11 – 2020-09-13 (×6): 5 mg via INTRAVENOUS
  Filled 2020-09-11 (×6): qty 2

## 2020-09-11 NOTE — Progress Notes (Signed)
Upper and lower extremity venous has been completed.   Preliminary results in CV Proc.   Blanch Media 09/11/2020 11:22 AM

## 2020-09-11 NOTE — Progress Notes (Signed)
Progress Note  13 Days Post-Op  Subjective: CC: vomited over 800+cc yesterday.  Says he still feels fine today and doesn't feel bloated.  Colostomy starting putting out 2.5L yesterday.  Denies chest pain, SOB, cough, just sinus congestion especially when NGT in place, denies dysuria, pain in his extremities.   Objective: Vital signs in last 24 hours: Temp:  [97.4 F (36.3 C)-98.5 F (36.9 C)] 97.4 F (36.3 C) (07/19 0802) Pulse Rate:  [94-112] 94 (07/19 0802) Resp:  [16-18] 16 (07/19 0802) BP: (126-141)/(81-89) 141/81 (07/19 0802) SpO2:  [97 %-100 %] 98 % (07/19 0802) Last BM Date: 09/10/20  Intake/Output from previous day: 07/18 0701 - 07/19 0700 In: 3478 [P.O.:1000; I.V.:2078; IV Piggyback:400] Out: 3475 [Emesis/NG output:800; Stool:2675] Intake/Output this shift: No intake/output data recorded.  PE: Abd: soft, but with some distention, +BS, colostomy is viable with bilious output, midline wound is overall very clean and packed.  JP drain with minimal serous fluid. Ext: all 4 extremities are symmetrical with no cyanosis, edema.  Calves soft and nontender, picc line in place in RUE   Lab Results:  Recent Labs    09/10/20 0311 09/11/20 0353  WBC 22.0* 24.2*  HGB 10.2* 9.9*  HCT 30.9* 29.5*  PLT 648* 576*   BMET Recent Labs    09/10/20 0311 09/11/20 0353  NA 136 130*  K 3.8 2.8*  CL 104 96*  CO2 24 26  GLUCOSE 106* 116*  BUN 12 8  CREATININE 0.48* 0.55*  CALCIUM 8.7* 8.6*   PT/INR No results for input(s): LABPROT, INR in the last 72 hours. CMP     Component Value Date/Time   NA 130 (L) 09/11/2020 0353   K 2.8 (L) 09/11/2020 0353   CL 96 (L) 09/11/2020 0353   CO2 26 09/11/2020 0353   GLUCOSE 116 (H) 09/11/2020 0353   BUN 8 09/11/2020 0353   CREATININE 0.55 (L) 09/11/2020 0353   CALCIUM 8.6 (L) 09/11/2020 0353   PROT 5.9 (L) 09/10/2020 0311   ALBUMIN 2.4 (L) 09/10/2020 0311   AST 31 09/10/2020 0311   ALT 30 09/10/2020 0311   ALKPHOS 82  09/10/2020 0311   BILITOT 0.4 09/10/2020 0311   GFRNONAA >60 09/11/2020 0353   Lipase     Component Value Date/Time   LIPASE 25 08/27/2020 1125       Studies/Results: CT ABDOMEN PELVIS W CONTRAST  Result Date: 09/10/2020 CLINICAL DATA:  Abdominal distension Nausea/vomiting s/p Hartman's with prolonged ileus and leukocytosi EXAM: CT ABDOMEN AND PELVIS WITH CONTRAST TECHNIQUE: Multidetector CT imaging of the abdomen and pelvis was performed using the standard protocol following bolus administration of intravenous contrast. CONTRAST:  OMNIPAQUE IOHEXOL 300 MG/ML  SOLN COMPARISON:  CT abdomen pelvis 09/04/2020 FINDINGS: Lower chest: Improving bilateral lower lobe tree-in-bud nodularity. Coronary artery calcifications. Hepatobiliary: No focal liver abnormality. No gallstones, gallbladder wall thickening, or pericholecystic fluid. No biliary dilatation. Pancreas: No focal lesion. Normal pancreatic contour. No surrounding inflammatory changes. No main pancreatic ductal dilatation. Spleen: Normal in size without focal abnormality. Adrenals/Urinary Tract: No adrenal nodule bilaterally. Bilateral kidneys enhance symmetrically. No hydronephrosis. No hydroureter. The urinary bladder is unremarkable. Stomach/Bowel: Surgical changes related to [facility] pouch formation and left lower lobe and colostomy formation. Gastric dilatation with fluid. Multiple loops of small bowel are distended with fluid measuring up to at least 5.5 cm on axial imaging. No definite transition point identified; however, the terminal ileum is again noted to be mostly decompressed with a similar region of change in  caliber within the left lower abdomen that is poorly visualized (6: 60-75). No bowel wall thickening of the small bowel or pneumatosis. Gas and fluid noted within the large bowel. No large bowel wall thickening. Scattered colonic diverticulosis. Appendix appears normal. Vascular/Lymphatic: No abdominal aorta or iliac  aneurysm. Severe atherosclerotic plaque of the aorta and its branches. No abdominal, pelvic, or inguinal lymphadenopathy. Reproductive: Prostate is unremarkable. Other: Right lower quadrant surgical drain with tip terminating in the left pelvis. No intraperitoneal free fluid. No intraperitoneal free gas. No organized fluid collection. Musculoskeletal: Midline anterior abdominal incision with persistent open soft tissue defect. No associated organized fluid collection or fat stranding to suggest infection. No suspicious lytic or blastic osseous lesions. No acute displaced fracture. Multilevel degenerative changes of the spine. IMPRESSION: 1. Similar-appearing fluid dilated stomach and small bowel with a mostly decompressed terminal ileum and a similar change in caliber of the small bowel within the left lower abdomen that is poorly visualized. Finding may represent a postop ileus versus a not excluded partial small bowel obstruction. Recommend enteric tube placement given gastric and proximal/mid small bowel dilatation with fluid. Consider use of both IV and PO contrast in future imaging given difficulty of visualizing the small bowel change in caliber in the left lower abdomen. 2. 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 3. Scattered colonic diverticulosis with no acute diverticulitis. 4. Improving bilateral lower lobe tree-in-bud nodularity likely representing a resolving infection/inflammation of the lungs. 5.  Aortic Atherosclerosis (ICD10-I70.0). Electronically Signed   By: Tish Frederickson M.D.   On: 09/10/2020 21:31    Anti-infectives: Anti-infectives (From admission, onward)    Start     Dose/Rate Route Frequency Ordered Stop   08/28/20 2315  piperacillin-tazobactam (ZOSYN) IVPB 3.375 g  Status:  Discontinued        3.375 g 12.5 mL/hr over 240 Minutes Intravenous Every 8 hours 08/27/20 2308 09/06/20 0956   08/27/20 1515  piperacillin-tazobactam (ZOSYN) IVPB 3.375 g         3.375 g 12.5 mL/hr over 240 Minutes Intravenous  Once 08/27/20 1507 08/28/20 1901        Assessment/Plan POD 13 s/p Exploratory laparotomy, 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 Fredricka Bonine 08/29/20 - Pathology without malignancy - CT 7/12, 7/18 w/ ileus. No IAA - WBC up to 24K today with no fever and no infectious etiology noted.  Check UA as well as dopplers of all extremities.  At this point, continue to monitor, will not add abx yet as no clear source to treat. - JP drain output serous and minimal 20cc/24hrs - BID WTD for midline - Mobilize - Pulm toilet -add reglan for ileus -starting putting a lot out in colostomy and will in NGT.  Monitor fluid shifts and replace as needed.  Will be on total of 160cc/hr today which is appropriate given output in last 24 hrs. -appreciate pharmacy assistance - WOC has seen and HH ordered. He has performed ostomy pouch changes independently. He reports his sister will help at discharge - hypokalemia - IV repletion has been ordered and in fluids as well.  BMET in am -prealbumin up to 13.5   FEN: NPO, TNA full rate, hypokalemia, replaced by pharmacy today ID: [email or web address] 7/4>7/14 VTE: SCDs, Lovenox Foley: removed 7/7, voiding   Tobacco use - nicotine patch   HTN - stop norvasc with reinsertion of NGT, change to lopressor 5mg  QID  LOS: 15 days  Letha Cape, [facility] 09/11/2020, 10:28 AM Please see Amion for pager number during day hours 7:00am-4:30pm

## 2020-09-11 NOTE — Progress Notes (Signed)
PHARMACY - TOTAL PARENTERAL NUTRITION CONSULT NOTE  Indication: Prolonged ileus  Patient Measurements: Height: 5\' 8"  (172.7 cm) Weight: 74 kg (163 lb 2.3 oz) IBW/kg (Calculated) : 68.4 TPN AdjBW (KG): 74.8 Body mass index is 24.81 kg/m. Usual Weight: 93 kg  Assessment:  [name] presented on 7/4 with N/V and abdominal pain x 2 days following constipation.  Found to have colonic obstruction with mass and abscess requiring ex-lap with sigmoid colectomy and end colostomy on 7/6.  Patient has nausea and vomiting post-op and was unable to start a diet. CT on 7/12 showed post-op ileus.  Pharmacy consulted for TPN management given inadequate intake for ~12 days.  Patient reports having Covid in Dec 2021 and intake has been reduced since.  He lost ~40 lbs in 7 months.  Patient is at risk for refeeding.  Glucose / Insulin: no hx DM. SSI/CBG checks D/C'ed 7/16.  <180 on BMET.  Electrolytes: previous significant NG losses - K 2.8 down (goal >/= 4), Cl low, Mg 1.9 (goal >=2)  Renal: SCr < 1, BUN WNL  Hepatic: LFTs / tbili WNL, prealbumin 11.8>>13.5, albumin 2.4, TG 147 on TPN    Intake / Output; MIVF: UOP not documented, NG d/c'd, JP drain 14mL, colostomy output 1m, vomiting - NS at 75 ml/hr - po intake: CLD>FLD>vomiting last 24h  Pertinent GI Imaging:  7/12 CT abd - post-op ileus  7/12 Xray - gastric distention  7/16 Abx XR - mildly progressive SB ileus or partial obstruction  GI Surgeries / Procedures:   7/6 ex-lap with sigmoid colectomy and end colostomy  Central access: PICC placed 09/05/20 TPN start date: 09/05/20  Nutritional Goals (per RD rec on 7/13): 2150-2350 kCal, 100-115g AA, >2L fluid per day  Current Nutrition:  TPN  Plan:  NPO again due to vomiting. Watch rising WBC (no fever) Increase TPN back to full rate 85 ml/hr today at 1800 - 105g protein, 65.2g lipids (29.88%), 326g dextrose (GIR 3), 2182 total kcal Add standard MVI and trace elements to  TPN Add thiamine and folate to TPN given hx EtOH use - plan for 7 days  through 7/19---remove from bag today. K runs x 5. Change IVF to NS + K6meq at 81ml/hr will provide 72m over 24h    Rilda Bulls S. , PharmD, BCPS Clinical Staff Pharmacist [email or web address] 09/11/2020, 7:48 AM

## 2020-09-11 NOTE — Care Management (Addendum)
Updated [facility] . They will take him off their referral list for now. When patient closer to discharge they will need a new referral to see if they can accept at that time.   Phone  [phone] ext [phone]

## 2020-09-12 DIAGNOSIS — R112 Nausea with vomiting, unspecified: Secondary | ICD-10-CM

## 2020-09-12 DIAGNOSIS — K567 Ileus, unspecified: Secondary | ICD-10-CM | POA: Diagnosis not present

## 2020-09-12 LAB — CBC
HCT: 30.6 % — ABNORMAL LOW (ref 39.0–52.0)
Hemoglobin: 10.1 g/dL — ABNORMAL LOW (ref 13.0–17.0)
MCH: 34.7 pg — ABNORMAL HIGH (ref 26.0–34.0)
MCHC: 33 g/dL (ref 30.0–36.0)
MCV: 105.2 fL — ABNORMAL HIGH (ref 80.0–100.0)
Platelets: 612 10*3/uL — ABNORMAL HIGH (ref 150–400)
RBC: 2.91 MIL/uL — ABNORMAL LOW (ref 4.22–5.81)
RDW: 14.9 % (ref 11.5–15.5)
WBC: 19.7 10*3/uL — ABNORMAL HIGH (ref 4.0–10.5)
nRBC: 0 % (ref 0.0–0.2)

## 2020-09-12 LAB — BASIC METABOLIC PANEL
Anion gap: 7 (ref 5–15)
BUN: 8 mg/dL (ref 6–20)
CO2: 26 mmol/L (ref 22–32)
Calcium: 8.7 mg/dL — ABNORMAL LOW (ref 8.9–10.3)
Chloride: 103 mmol/L (ref 98–111)
Creatinine, Ser: 0.52 mg/dL — ABNORMAL LOW (ref 0.61–1.24)
GFR, Estimated: >60 mL/min (ref >60)
Glucose, Bld: 110 mg/dL — ABNORMAL HIGH (ref 70–99)
Potassium: 3.4 mmol/L — ABNORMAL LOW (ref 3.5–5.1)
Sodium: 136 mmol/L (ref 135–145)

## 2020-09-12 LAB — MAGNESIUM: Magnesium: 2 mg/dL (ref 1.7–2.4)

## 2020-09-12 LAB — PHOSPHORUS: Phosphorus: 3.4 mg/dL (ref 2.5–4.6)

## 2020-09-12 MED ORDER — BOOST PLUS PO LIQD
237.0000 mL | Freq: Three times a day (TID) | ORAL | Status: DC
  Administered 2020-09-12 – 2020-09-14 (×3): 237 mL via ORAL
  Filled 2020-09-12 (×8): qty 237

## 2020-09-12 MED ORDER — TRAVASOL 10 % IV SOLN
INTRAVENOUS | Status: AC
  Filled 2020-09-12: qty 1050.6

## 2020-09-12 MED ORDER — POTASSIUM CHLORIDE 10 MEQ/50ML IV SOLN
10.0000 meq | INTRAVENOUS | Status: AC
Start: 2020-09-12 — End: 2020-09-12
  Administered 2020-09-12 (×4): 10 meq via INTRAVENOUS
  Filled 2020-09-12 (×4): qty 50

## 2020-09-12 NOTE — Consult Note (Signed)
Referring Provider: Dr. Manus Rudd  Primary Care Physician:  Pcp, No Primary Gastroenterologist:  Dr. Marney Setting at [facility].    Reason for Consultation:  Re-Consult for N/V  HPI: Ryan Velasquez is a 50 y.o. male tension, alcohol use disorder and diverticulitis.  He was admitted to the hospital on 08/27/2020 secondary to having N/V, abdominal pain and constipation.  An abdominal/pelvic CT scan identified a distal colonic obstruction with a 4.7 cm peridiverticular abscess.  He was evaluated by Dr.  Marina Goodell  and Donalee Citrin PA-C from our GI service at that time for consideration for a flexible sigmoidoscopy. Dr. Marina Goodell did not feel a sigmoidoscopy was necessary and he recommended for the patient to proceed with surgery. He subsequently underwent an exploratory laparotomy, sigmoid colectomy with end colostomy by Dr. Phylliss Blakes on 08/29/2020.  Postoperatively, he was placed on TPN and he required an NG tube which was discontinued then replaced at least twice.  Approximately 7 to 10 days ago, he tried eating Jell-O and yogurt which he vomited up.  He continued to have nausea and vomiting despite large ostomy and NG tube tube output.  He reported having chronic nausea and vomiting for 8 months for which he saw Dr. Noe Gens at [facility] health plan to perform an EGD in the near future but was not done due to to this hospitalization. A GI consult was requested for further evaluation.  He reports having heartburn for which he took Omeprazole 20 mg daily for the past 3 months.  No dysphagia.  He initially developed nausea and vomiting 8 to 9 months ago which worsened after he was diagnosed with COVID-12 February 2020.  He initially vomited partially digested food several hours after eating twice weekly, after his COVID infection he was vomiting once every other day.  No hematemesis.  No upper or lower abdominal pain.  He takes ASA 81 mg once daily.  No other NSAID use.  He drinks 1 pint to 1/5 of liquor 5 to 7  days weekly.  However, he stopped drinking all alcohol 2 weeks prior to his hospital admission.  He was evaluated by GI Dr. Marney Setting at [facility] health who prescribed Zofran which controlled his nausea and vomiting and he was scheduled for an EGD 09/25/2020.  He denies ever having an EGD or screening colonoscopy.  No known family history of esophageal, gastric or colon cancer.   Past Medical History:  Diagnosis Date   Alcohol abuse    Hypertension     Past Surgical History:  Procedure Laterality Date   BACK SURGERY     BACK SURGERY     L4-5 herniated disc   COLOSTOMY N/A 08/29/2020   Procedure: COLOSTOMY;  Surgeon: Berna Bue, MD;  Location: [facility] OR;  Service: General;  Laterality: N/A;   LAPAROTOMY N/A 08/29/2020   Procedure: EXPLORATORY LAPAROTOMY;  Surgeon: Berna Bue, MD;  Location: [facility] OR;  Service: General;  Laterality: N/A;   PARTIAL COLECTOMY N/A 08/29/2020   Procedure: PARTIAL SIGMOID COLECTOMY;  Surgeon: Berna Bue, MD;  Location: [facility] OR;  Service: General;  Laterality: N/A;    Prior to Admission medications   Medication Sig Start Date End Date Taking? Authorizing Provider  ALLERGY RELIEF 10 MG tablet Take 10 mg by mouth daily. 05/28/20  Yes [provider]  amLODipine (NORVASC) 5 MG tablet Take 5 mg by mouth daily. 05/28/20  Yes [provider]  bisacodyl (DULCOLAX) 10 MG suppository Place 10 mg rectally as needed  for moderate constipation.   Yes [provider]  bisacodyl 5 MG EC tablet Take 5 mg by mouth daily as needed for moderate constipation.   Yes [provider]  diazepam (VALIUM) 5 MG/ML solution Take 10 mg by mouth daily as needed (seizures). Give /64mL IM STAT and call on call medical staff. May repeat dose one time if seizure persists greater than 30 seconds after the first dose given.   Yes [provider]  docusate sodium (COLACE) 100 MG capsule Take 100 mg by mouth daily as needed for mild constipation.    Yes [provider]  losartan (COZAAR) 25 MG tablet Take 25 mg by mouth 2 (two) times daily. 05/10/20  Yes [provider]  magnesium hydroxide (MILK OF MAGNESIA) 400 MG/5ML suspension Take 30 mLs by mouth daily as needed for mild constipation.   Yes [provider]  Multiple Vitamins-Minerals (MULTI COMPLETE) CAPS Take 1 capsule by mouth daily.   Yes [provider]  omeprazole (PRILOSEC) 20 MG capsule Take 20 mg by mouth daily. 08/15/20  Yes [provider]  ondansetron (ZOFRAN-ODT) 8 MG disintegrating tablet Take 8 mg by mouth every 8 (eight) hours as needed for nausea or vomiting.   Yes [provider]  thiamine 100 MG tablet Take 100 mg by mouth daily.   Yes [provider]  traZODone (DESYREL) 50 MG tablet Take 50 mg by mouth at bedtime as needed for sleep.   Yes [provider]  metoprolol tartrate (LOPRESSOR) 25 MG tablet Take 25 mg by mouth 2 (two) times daily. Patient not taking: Reported on 08/28/2020 03/22/20   [provider]    Current Facility-Administered Medications  Medication Dose Route Frequency Provider Last Rate Last Admin   0.9 %  sodium chloride infusion   Intravenous Continuous Eric Form, PA-C   Stopped at 09/08/20 2023   0.9 % NaCl with KCl 40 mEq / L  infusion   Intravenous Continuous Norva Pavlov, RPH 75 mL/hr at 09/12/20 0700 Infusion Verify at 09/12/20 0700   acetaminophen (TYLENOL) tablet 1,000 mg  1,000 mg Oral Q6H PRN Eric Form, PA-C   1,000 mg at 09/08/20 1712   alum & mag hydroxide-simeth (MAALOX/MYLANTA) 200-200-20 MG/5ML suspension 30 mL  30 mL Oral Q4H PRN Barnetta Chapel, PA-C   30 mL at 09/10/20 1556   amLODipine (NORVASC) tablet 5 mg  5 mg Oral Daily Jacinto Halim, PA-C   5 mg at 09/12/20 0940   Chlorhexidine Gluconate Cloth 2 % PADS 6 each  6 each Topical Daily Jacinto Halim, PA-C   6 each at 09/12/20 1610   diazepam (VALIUM) 5 MG/ML concentrated  oral solution 10 mg  10 mg Oral Daily PRN Jacinto Halim, PA-C       diphenhydrAMINE (BENADRYL) 12.5 MG/5ML elixir 12.5 mg  12.5 mg Oral Q6H PRN Maczis, Elmer Sow, PA-C       Or   diphenhydrAMINE (BENADRYL) injection 12.5 mg  12.5 mg Intravenous Q6H PRN Jacinto Halim, PA-C   12.5 mg at 09/06/20 2115   enoxaparin (LOVENOX) injection 40 mg  40 mg Subcutaneous Q24H Jacinto Halim, PA-C   40 mg at 09/12/20 0940   guaiFENesin (ROBITUSSIN) 100 MG/5ML solution 100 mg  5 mL Oral Q6H PRN Barnetta Chapel, PA-C       HYDROmorphone (DILAUDID) injection 0.5-1 mg  0.5-1 mg Intravenous Q4H PRN Eric Form, PA-C   1 mg at 09/12/20 1202  labetalol (NORMODYNE) injection 10 mg  10 mg Intravenous Q2H PRN Emelia Loron, MD       lactose free nutrition (BOOST PLUS) liquid 237 mL  237 mL Oral TID WC Barnetta Chapel, PA-C       menthol-cetylpyridinium (CEPACOL) lozenge 3 mg  1 lozenge Oral PRN Emelia Loron, MD       methocarbamol (ROBAXIN) 1,000 mg in dextrose 5 % 100 mL IVPB  1,000 mg Intravenous Q6H Eric Form, PA-C 200 mL/hr at 09/12/20 0800 1,000 mg at 09/12/20 0800   metoCLOPramide (REGLAN) injection 5 mg  5 mg Intravenous Q8H Barnetta Chapel, PA-C   5 mg at 09/12/20 1610   metoprolol tartrate (LOPRESSOR) injection 5 mg  5 mg Intravenous Q6H Barnetta Chapel, PA-C   5 mg at 09/12/20 1156   nicotine (NICODERM CQ - dosed in mg/24 hours) patch 14 mg  14 mg Transdermal Daily Jacinto Halim, PA-C   14 mg at 09/12/20 0940   ondansetron (ZOFRAN) injection 4 mg  4 mg Intravenous Q6H PRN Jacinto Halim, PA-C   4 mg at 09/10/20 1435   oxyCODONE (Oxy IR/ROXICODONE) immediate release tablet 5-10 mg  5-10 mg Oral Q4H PRN Eric Form, PA-C   5 mg at 09/12/20 0230   pantoprazole (PROTONIX) injection 40 mg  40 mg Intravenous QHS Jacinto Halim, PA-C   40 mg at 09/11/20 2152   phenol (CHLORASEPTIC) mouth spray 1 spray  1 spray Mouth/Throat PRN Eric Form, PA-C   1 spray at 09/07/20  2050   prochlorperazine (COMPAZINE) tablet 10 mg  10 mg Oral Q6H PRN Jacinto Halim, PA-C       Or   prochlorperazine (COMPAZINE) injection 5-10 mg  5-10 mg Intravenous Q6H PRN Jacinto Halim, PA-C   10 mg at 09/02/20 9604   silver nitrate applicators applicator 1 application  1 application Topical PRN Maczis, Elmer Sow, PA-C       simethicone [facility]) chewable tablet 80 mg  80 mg Oral QID PRN Jacinto Halim, PA-C   80 mg at 08/28/20 1538   sodium chloride flush (NS) 0.9 % injection 10-40 mL  10-40 mL Intracatheter Q12H Jacinto Halim, PA-C   10 mL at 09/09/20 2157   sodium chloride flush (NS) 0.9 % injection 10-40 mL  10-40 mL Intracatheter PRN Jacinto Halim, PA-C   10 mL at 09/07/20 1738   TPN ADULT (ION)   Intravenous Continuous TPN Norva Pavlov, RPH 85 mL/hr at 09/12/20 0700 Infusion Verify at 09/12/20 0700   TPN ADULT (ION)   Intravenous Continuous TPN Gerilyn Nestle, RPH       traZODone (DESYREL) tablet 50 mg  50 mg Oral QHS PRN Jacinto Halim, PA-C        Allergies as of 08/27/2020   (No Known Allergies)    History reviewed. No pertinent family history.  Social History   Socioeconomic History   Marital status: Single    Spouse name: Not on file   Number of children: Not on file   Years of education: Not on file   Highest education level: Not on file  Occupational History   Not on file  Tobacco Use   Smoking status: Every Day    Packs/day: 1.00    Years: 20.00    Pack years: 20.00    Types: Cigarettes   Smokeless tobacco: Current    Types: Snuff  Vaping Use   Vaping Use: Never used  Substance  and Sexual Activity   Alcohol use: Yes    Comment: last drink approximately 1 1/2 weeks ago, history of alcohol abuse   Drug use: Not on file   Sexual activity: Not on file  Other Topics Concern   Not on file  Social History Narrative   Not on file   Social Determinants of Health   Financial Resource Strain: Not on file  Food Insecurity: Not on  file  Transportation Needs: Not on file  Physical Activity: Not on file  Stress: Not on file  Social Connections: Not on file  Intimate Partner Violence: Not on file    Review of Systems: See HPI, all other systems reviewed and are negative  Physical Exam: Vital signs in last 24 hours: Temp:  [98 F (36.7 C)-98.4 F (36.9 C)] 98.2 F (36.8 C) (07/20 1142) Pulse Rate:  [85-100] 93 (07/20 1142) Resp:  [16-18] 16 (07/20 1142) BP: (114-143)/(68-96) 131/68 (07/20 1142) SpO2:  [97 %-100 %] 97 % (07/20 1142) Weight:  [68.9 kg] 68.9 kg (07/20 1142) Last BM Date: 09/12/20 General:  Alert 50 year old male in no acute distress. Head:  Normocephalic and atraumatic. Eyes:  No scleral icterus. Conjunctiva pink. Ears:  Normal auditory acuity. Nose:  No deformity, discharge or lesions. Mouth:  Dentition intact. No ulcers or lesions.  Neck:  Supple. No lymphadenopathy or thyromegaly.  Lungs: Breath sounds clear throughout. Heart: Regular rate and rhythm, no murmurs. Abdomen: Soft, nondistended.  Nontender.  Midline incision dressing dry and intact.  RLQ JP drain with a small amount of serous drainage.  LUQ Rectal: Deferred. Musculoskeletal:  Symmetrical without gross deformities.  Pulses:  Normal pulses noted. Extremities:  Without clubbing or edema. Neurologic:  Alert and  oriented x4. No focal deficits.  Skin:  Intact without significant lesions or rashes. Psych:  Alert and cooperative. Normal mood and affect.  Intake/Output from previous day: 07/19 0701 - 07/20 0700 In: 3386.1 [I.V.:2675.8; NG/GT:60; IV Piggyback:650.3] Out: 3135 [Emesis/NG output:2900; Drains:10; Stool:225] Intake/Output this shift: Total I/O In: -  Out: 1150 [Emesis/NG output:1150]  Lab Results: Recent Labs    09/10/20 0311 09/11/20 0353 09/12/20 0433  WBC 22.0* 24.2* 19.7*  HGB 10.2* 9.9* 10.1*  HCT 30.9* 29.5* 30.6*  PLT 648* 576* 612*   BMET Recent Labs    09/10/20 0311 09/11/20 0353  09/12/20 0433  NA 136 130* 136  K 3.8 2.8* 3.4*  CL 104 96* 103  CO2 GLUCOSE 106* 116* 110*  BUN CREATININE 0.48* 0.55* 0.52*  CALCIUM 8.7* 8.6* 8.7*   LFT Recent Labs    09/10/20 0311  PROT 5.9*  ALBUMIN 2.4*  AST 31  ALT 30  ALKPHOS 82  BILITOT 0.4   PT/INR No results for input(s): LABPROT, INR in the last 72 hours. Hepatitis Panel No results for input(s): HEPBSAG, HCVAB, HEPAIGM, HEPBIGM in the last 72 hours.    Studies/Results: DG Abd 1 View  Result Date: 09/11/2020 CLINICAL DATA:  Nasogastric tube placement post abdominal surgery on 08/29/2020 EXAM: ABDOMEN - 1 VIEW COMPARISON:  Portable exam 1004 hours compared to 09/08/2020 FINDINGS: Nasogastric tube coiled in distal esophagus. Persistent dilatation of small bowel loops in the abdomen. Gas also present in stomach and at distal transverse colon. Lung bases clear. IMPRESSION: Nasogastric tube coiled in distal esophagus; recommend withdrawal and replacement. Findings called to [facility] RN on 09/11/2020 at 1148 hours. Electronically Signed   By: Ulyses Southward M.D.   On: 09/11/2020 11:48  CT ABDOMEN PELVIS W CONTRAST  Result Date: 09/10/2020 CLINICAL DATA:  Abdominal distension Nausea/vomiting s/p Hartman's with prolonged ileus and leukocytosi EXAM: CT ABDOMEN AND PELVIS WITH CONTRAST TECHNIQUE: Multidetector CT imaging of the abdomen and pelvis was performed using the standard protocol following bolus administration of intravenous contrast. CONTRAST:  100mL OMNIPAQUE IOHEXOL 300 MG/ML  SOLN COMPARISON:  CT abdomen pelvis 09/04/2020 FINDINGS: Lower chest: Improving bilateral lower lobe tree-in-bud nodularity. Coronary artery calcifications. Hepatobiliary: No focal liver abnormality. No gallstones, gallbladder wall thickening, or pericholecystic fluid. No biliary dilatation. Pancreas: No focal lesion. Normal pancreatic contour. No surrounding inflammatory changes. No main pancreatic ductal dilatation. Spleen:  Normal in size without focal abnormality. Adrenals/Urinary Tract: No adrenal nodule bilaterally. Bilateral kidneys enhance symmetrically. No hydronephrosis. No hydroureter. The urinary bladder is unremarkable. Stomach/Bowel: [facility] and left lower lobe and colostomy formation. Gastric dilatation with fluid. Multiple loops of small bowel are distended with fluid measuring up to at least 5.5 cm on axial imaging. No definite transition point identified; however, the terminal ileum is again noted to be mostly decompressed with a similar region of change in caliber within the left lower abdomen that is poorly visualized (6: 60-75). No bowel wall thickening of the small bowel or pneumatosis. Gas and fluid noted within the large bowel. No large bowel wall thickening. Scattered colonic diverticulosis. Appendix appears normal. Vascular/Lymphatic: No abdominal aorta or iliac aneurysm. Severe atherosclerotic plaque of the aorta and its branches. No abdominal, pelvic, or inguinal lymphadenopathy. Reproductive: Prostate is unremarkable. Other: Right lower quadrant surgical drain with tip terminating in the left pelvis. No intraperitoneal free fluid. No intraperitoneal free gas. No organized fluid collection. Musculoskeletal: Midline anterior abdominal incision with persistent open soft tissue defect. No associated organized fluid collection or fat stranding to suggest infection. No suspicious lytic or blastic osseous lesions. No acute displaced fracture. Multilevel degenerative changes of the spine. IMPRESSION: 1. Similar-appearing fluid dilated stomach and small bowel with a mostly decompressed terminal ileum and a similar change in caliber of the small bowel within the left lower abdomen that is poorly visualized. Finding may represent a postop ileus versus a not excluded partial small bowel obstruction. Recommend enteric tube placement given gastric and proximal/mid small bowel  dilatation with fluid. Consider use of both IV and PO contrast in future imaging given difficulty of visualizing the small bowel change in caliber in the left lower abdomen. 2. 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 3. Scattered colonic diverticulosis with no acute diverticulitis. 4. Improving bilateral lower lobe tree-in-bud nodularity likely representing a resolving infection/inflammation of the lungs. 5.  Aortic Atherosclerosis (ICD10-I70.0). Electronically Signed   By: Tish FredericksonMorgane  Naveau M.D.   On: 09/10/2020 21:31   DG Abd Portable 1V  Result Date: 09/11/2020 CLINICAL DATA:  Nasogastric tube. EXAM: PORTABLE ABDOMEN - 1 VIEW COMPARISON:  Same day. FINDINGS: The bowel gas pattern is normal. No radio-opaque calculi or other significant radiographic abnormality are seen. Right-sided PICC line is unchanged in position. Distal tip of nasogastric tube is seen in proximal stomach. IMPRESSION: Distal tip of nasogastric tube seen in proximal stomach. Electronically Signed   By: Lupita RaiderJames  Green Jr M.D.   On: 09/11/2020 20:40   VAS US LOWER EXTREMITY VENOUS (DVT)  Result Date: 09/11/2020  Lower Venous DVT Study Patient Name:  Pura SpiceNDY Forney  Date of Exam:   09/11/2020 Medical Rec #: [id number]   Accession #:    [id number] Date of Birth: 05-28-1970  Patient Gender: M Patient Age:   44Y Exam Location:  [facility] Procedure:      VAS [place] LOWER EXTREMITY VENOUS (DVT) Referring Phys: [id number] KELLY OSBORNE --------------------------------------------------------------------------------  Indications: Leukocytosis of unclear etiology.  Comparison Study: no prior Performing Technologist: [place] Ponder RVS  Examination Guidelines: A complete evaluation includes B-mode imaging, spectral Doppler, color Doppler, and power Doppler as needed of all accessible portions of each vessel. Bilateral testing is considered an integral part of a complete examination. Limited examinations for reoccurring  indications may be performed as noted. The reflux portion of the exam is performed with the patient in reverse Trendelenburg.  +---------+---------------+---------+-----------+----------+--------------+ RIGHT    CompressibilityPhasicitySpontaneityPropertiesThrombus Aging +---------+---------------+---------+-----------+----------+--------------+ CFV      Full           Yes      Yes                                 +---------+---------------+---------+-----------+----------+--------------+ SFJ      Full                                                        +---------+---------------+---------+-----------+----------+--------------+ FV Prox  Full                                                        +---------+---------------+---------+-----------+----------+--------------+ FV Mid   Full                                                        +---------+---------------+---------+-----------+----------+--------------+ FV DistalFull                                                        +---------+---------------+---------+-----------+----------+--------------+ PFV      Full                                                        +---------+---------------+---------+-----------+----------+--------------+ POP      Full           Yes      Yes                                 +---------+---------------+---------+-----------+----------+--------------+ PTV      Full                                                        +---------+---------------+---------+-----------+----------+--------------+  PERO     Full                                                        +---------+---------------+---------+-----------+----------+--------------+   +---------+---------------+---------+-----------+----------+--------------+ LEFT     CompressibilityPhasicitySpontaneityPropertiesThrombus Aging  +---------+---------------+---------+-----------+----------+--------------+ CFV      Full           Yes      Yes                                 +---------+---------------+---------+-----------+----------+--------------+ SFJ      Full                                                        +---------+---------------+---------+-----------+----------+--------------+ FV Prox  Full                                                        +---------+---------------+---------+-----------+----------+--------------+ FV Mid   Full                                                        +---------+---------------+---------+-----------+----------+--------------+ FV DistalFull                                                        +---------+---------------+---------+-----------+----------+--------------+ PFV      Full                                                        +---------+---------------+---------+-----------+----------+--------------+ POP      Full           Yes      Yes                                 +---------+---------------+---------+-----------+----------+--------------+ PTV      Full                                                        +---------+---------------+---------+-----------+----------+--------------+ PERO     Full                                                        +---------+---------------+---------+-----------+----------+--------------+  Summary: BILATERAL: - No evidence of deep vein thrombosis seen in the lower extremities, bilaterally. -No evidence of popliteal cyst, bilaterally.   *See table(s) above for measurements and observations. Electronically signed by Waverly Ferrari MD on 09/11/2020 at 5:13:41 PM.    Final    VAS [place] UPPER EXTREMITY VENOUS DUPLEX  Result Date: 09/11/2020 UPPER VENOUS STUDY  Patient Name:  BORNA WESSINGER  Date of Exam:   09/11/2020 Medical Rec #: [id number]   Accession #:    [id number] Date of Birth:  26-Jul-1970   Patient Gender: M Patient Age:   49Y Exam Location:  [facility] Procedure:      VAS [place] UPPER EXTREMITY VENOUS DUPLEX Referring Phys: [id number] KELLY OSBORNE --------------------------------------------------------------------------------  Indications: leukocytosis of unclear etiology Comparison Study: no prior Performing Technologist: [place] Ponder RVS  Examination Guidelines: A complete evaluation includes B-mode imaging, spectral Doppler, color Doppler, and power Doppler as needed of all accessible portions of each vessel. Bilateral testing is considered an integral part of a complete examination. Limited examinations for reoccurring indications may be performed as noted.  Right Findings: +----------+------------+---------+-----------+----------+-----------------+ RIGHT     CompressiblePhasicitySpontaneousProperties     Summary      +----------+------------+---------+-----------+----------+-----------------+ IJV           Full       Yes       Yes                                +----------+------------+---------+-----------+----------+-----------------+ Subclavian    Full       Yes       Yes                                +----------+------------+---------+-----------+----------+-----------------+ Axillary      Full       Yes       Yes                                +----------+------------+---------+-----------+----------+-----------------+ Brachial      Full       Yes       Yes                                +----------+------------+---------+-----------+----------+-----------------+ Radial        Full                                                    +----------+------------+---------+-----------+----------+-----------------+ Ulnar         Full                                                    +----------+------------+---------+-----------+----------+-----------------+ Cephalic      Full                                                     +----------+------------+---------+-----------+----------+-----------------+  Basilic       None                                  Age Indeterminate +----------+------------+---------+-----------+----------+-----------------+  Left Findings: +----------+------------+---------+-----------+----------+-------+ LEFT      CompressiblePhasicitySpontaneousPropertiesSummary +----------+------------+---------+-----------+----------+-------+ IJV           Full       Yes       Yes                      +----------+------------+---------+-----------+----------+-------+ Subclavian    Full       Yes       Yes                      +----------+------------+---------+-----------+----------+-------+ Axillary      Full       Yes       Yes                      +----------+------------+---------+-----------+----------+-------+ Brachial      Full       Yes       Yes                      +----------+------------+---------+-----------+----------+-------+ Radial        Full                                          +----------+------------+---------+-----------+----------+-------+ Ulnar         Full                                          +----------+------------+---------+-----------+----------+-------+ Cephalic      Full                                          +----------+------------+---------+-----------+----------+-------+ Basilic       Full                                          +----------+------------+---------+-----------+----------+-------+  Summary:  Right: No evidence of deep vein thrombosis in the upper extremity. Findings consistent with age indeterminate superficial vein thrombosis involving the right basilic vein.  Left: No evidence of deep vein thrombosis in the upper extremity. No evidence of superficial vein thrombosis in the upper extremity. No evidence of thrombosis in the subclavian.  *See table(s) above for measurements and observations.   Diagnosing physician: Waverly Ferrari MD Electronically signed by Waverly Ferrari MD on 09/11/2020 at 5:13:56 PM.    Final     IMPRESSION/PLAN:  45. 50 year old male GERD symptoms on PPI x 3 months with with chronic nausea and vomiting x 8 to 9 months.  NG tube to low intermittent suction. CTAP with contrast 7/18 showed similar appearing fluid dilated stomach and small bowel with a mostly decompressed TI, findings possibly representing postoperative ileus, small bowel partial obstruction could not be completely ruled out.  NG tube was reinserted.  -NPO after midnight -EGD 09/11/2020. EGD  benefits and risks discussed including risk with sedation, risk of bleeding, perforation and infection  -Continue Reglan 5 mg IV.  On -Continue ondansetron 4 mg IV every 6 hours as needed -Limit narcotic use -Further recommendations per Dr. Adela Lank   2. 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  Alcohol use disorder.  Abstinent from alcohol x 5 weeks.    Malachi Carl Kennedy-Smith  09/12/2020, 2:17 PM

## 2020-09-12 NOTE — Progress Notes (Signed)
Progress Note  14 Days Post-Op  Subjective: CC: feels better with NGT in place.  Has had over 4 cups of ice since last night.  Ostomy still working well.  Patient tells me he has been having progressive chronic nausea and vomiting for over 8 months now.  He has seen a GI doctor with [facility] earlier this year who was planning to perform an EGD for evaluation.  He was controlled with zofran at home.   Objective: Vital signs in last 24 hours: Temp:  [98 F (36.7 C)-98.4 F (36.9 C)] 98.4 F (36.9 C) (07/20 0756) Pulse Rate:  [85-100] 90 (07/20 0756) Resp:  [16-18] 16 (07/20 0756) BP: (114-143)/(70-96) 143/76 (07/20 0756) SpO2:  [98 %-100 %] 98 % (07/20 0756) Last BM Date: 09/11/20 (thru colostomy)  Intake/Output from previous day: 07/19 0701 - 07/20 0700 In: 3386.1 [I.V.:2675.8; NG/GT:60; IV Piggyback:650.3] Out: 3135 [Emesis/NG output:2900; Drains:10; Stool:225] Intake/Output this shift: No intake/output data recorded.  PE: Abd: much softer today after NGT placement.  NGT with watered down bilious output.  Eating a ton of ice.  Midline stable, JP with minimal serous output.  Colostomy with viable stoma, and 500cc of output yesterday. Ext: all 4 extremities are symmetrical with no cyanosis, edema.  Calves soft and nontender, picc line in place in RUE   Lab Results:  Recent Labs    09/11/20 0353 09/12/20 0433  WBC 24.2* 19.7*  HGB 9.9* 10.1*  HCT 29.5* 30.6*  PLT 576* 612*   BMET Recent Labs    09/11/20 0353 09/12/20 0433  NA 130* 136  K 2.8* 3.4*  CL 96* 103  CO2 26 26  GLUCOSE 116* 110*  BUN 8 8  CREATININE 0.55* 0.52*  CALCIUM 8.6* 8.7*   PT/INR No results for input(s): LABPROT, INR in the last 72 hours. CMP     Component Value Date/Time   NA 136 09/12/2020 0433   K 3.4 (L) 09/12/2020 0433   CL 103 09/12/2020 0433   CO2 26 09/12/2020 0433   GLUCOSE 110 (H) 09/12/2020 0433   BUN 8 09/12/2020 0433   CREATININE 0.52 (L) 09/12/2020 0433   CALCIUM 8.7  (L) 09/12/2020 0433   PROT 5.9 (L) 09/10/2020 0311   ALBUMIN 2.4 (L) 09/10/2020 0311   AST 31 09/10/2020 0311   ALT 30 09/10/2020 0311   ALKPHOS 82 09/10/2020 0311   BILITOT 0.4 09/10/2020 0311   GFRNONAA >60 09/12/2020 0433   Lipase     Component Value Date/Time   LIPASE 25 08/27/2020 1125       Studies/Results: DG Abd 1 View  Result Date: 09/11/2020 CLINICAL DATA:  Nasogastric tube placement post abdominal surgery on 08/29/2020 EXAM: ABDOMEN - 1 VIEW COMPARISON:  Portable exam 1004 hours compared to 09/08/2020 FINDINGS: Nasogastric tube coiled in distal esophagus. Persistent dilatation of small bowel loops in the abdomen. Gas also present in stomach and at distal transverse colon. Lung bases clear. IMPRESSION: Nasogastric tube coiled in distal esophagus; recommend withdrawal and replacement. Findings called to [facility] RN on 09/11/2020 at 1148 hours. Electronically Signed   By: Ulyses SouthwardMark  Boles M.D.   On: 09/11/2020 11:48   CT ABDOMEN PELVIS W CONTRAST  Result Date: 09/10/2020 CLINICAL DATA:  Abdominal distension Nausea/vomiting s/p Hartman's with prolonged ileus and leukocytosi EXAM: CT ABDOMEN AND PELVIS WITH CONTRAST TECHNIQUE: Multidetector CT imaging of the abdomen and pelvis was performed using the standard protocol following bolus administration of intravenous contrast. CONTRAST:  100mL OMNIPAQUE IOHEXOL 300 MG/ML  SOLN  COMPARISON:  CT abdomen pelvis 09/04/2020 FINDINGS: Lower chest: Improving bilateral lower lobe tree-in-bud nodularity. Coronary artery calcifications. Hepatobiliary: No focal liver abnormality. No gallstones, gallbladder wall thickening, or pericholecystic fluid. No biliary dilatation. Pancreas: No focal lesion. Normal pancreatic contour. No surrounding inflammatory changes. No main pancreatic ductal dilatation. Spleen: Normal in size without focal abnormality. Adrenals/Urinary Tract: No adrenal nodule bilaterally. Bilateral kidneys enhance symmetrically. No  hydronephrosis. No hydroureter. The urinary bladder is unremarkable. Stomach/Bowel: Surgical changes related to [facility] pouch formation and left lower lobe and colostomy formation. Gastric dilatation with fluid. Multiple loops of small bowel are distended with fluid measuring up to at least 5.5 cm on axial imaging. No definite transition point identified; however, the terminal ileum is again noted to be mostly decompressed with a similar region of change in caliber within the left lower abdomen that is poorly visualized (6: 60-75). No bowel wall thickening of the small bowel or pneumatosis. Gas and fluid noted within the large bowel. No large bowel wall thickening. Scattered colonic diverticulosis. Appendix appears normal. Vascular/Lymphatic: No abdominal aorta or iliac aneurysm. Severe atherosclerotic plaque of the aorta and its branches. No abdominal, pelvic, or inguinal lymphadenopathy. Reproductive: Prostate is unremarkable. Other: Right lower quadrant surgical drain with tip terminating in the left pelvis. No intraperitoneal free fluid. No intraperitoneal free gas. No organized fluid collection. Musculoskeletal: Midline anterior abdominal incision with persistent open soft tissue defect. No associated organized fluid collection or fat stranding to suggest infection. No suspicious lytic or blastic osseous lesions. No acute displaced fracture. Multilevel degenerative changes of the spine. IMPRESSION: 1. Similar-appearing fluid dilated stomach and small bowel with a mostly decompressed terminal ileum and a similar change in caliber of the small bowel within the left lower abdomen that is poorly visualized. Finding may represent a postop ileus versus a not excluded partial small bowel obstruction. Recommend enteric tube placement given gastric and proximal/mid small bowel dilatation with fluid. Consider use of both IV and PO contrast in future imaging given difficulty of visualizing the small bowel change in  caliber in the left lower abdomen. 2. 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 3. Scattered colonic diverticulosis with no acute diverticulitis. 4. Improving bilateral lower lobe tree-in-bud nodularity likely representing a resolving infection/inflammation of the lungs. 5.  Aortic Atherosclerosis (ICD10-I70.0). Electronically Signed   By: Tish Frederickson M.D.   On: 09/10/2020 21:31   DG Abd Portable 1V  Result Date: 09/11/2020 CLINICAL DATA:  Nasogastric tube. EXAM: PORTABLE ABDOMEN - 1 VIEW COMPARISON:  Same day. FINDINGS: The bowel gas pattern is normal. No radio-opaque calculi or other significant radiographic abnormality are seen. Right-sided PICC line is unchanged in position. Distal tip of nasogastric tube is seen in proximal stomach. IMPRESSION: Distal tip of nasogastric tube seen in proximal stomach. Electronically Signed   By: Lupita Raider M.D.   On: 09/11/2020 20:40   VAS [place] LOWER EXTREMITY VENOUS (DVT)  Result Date: 09/11/2020  Lower Venous DVT Study Patient Name:  Ryan Velasquez  Date of Exam:   09/11/2020 Medical Rec #: [id number]   Accession #:    [id number] Date of Birth: March 24, 1970   Patient Gender: M Patient Age:   49Y Exam Location:  [facility] Procedure:      VAS [place] LOWER EXTREMITY VENOUS (DVT) Referring Phys: [id number] Winnifred Dufford --------------------------------------------------------------------------------  Indications: Leukocytosis of unclear etiology.  Comparison Study: no prior Performing Technologist: [place] Ponder RVS  Examination Guidelines: A complete evaluation includes B-mode imaging, spectral Doppler,  color Doppler, and power Doppler as needed of all accessible portions of each vessel. Bilateral testing is considered an integral part of a complete examination. Limited examinations for reoccurring indications may be performed as noted. The reflux portion of the exam is performed with the patient in reverse Trendelenburg.   +---------+---------------+---------+-----------+----------+--------------+ RIGHT    CompressibilityPhasicitySpontaneityPropertiesThrombus Aging +---------+---------------+---------+-----------+----------+--------------+ CFV      Full           Yes      Yes                                 +---------+---------------+---------+-----------+----------+--------------+ SFJ      Full                                                        +---------+---------------+---------+-----------+----------+--------------+ FV Prox  Full                                                        +---------+---------------+---------+-----------+----------+--------------+ FV Mid   Full                                                        +---------+---------------+---------+-----------+----------+--------------+ FV DistalFull                                                        +---------+---------------+---------+-----------+----------+--------------+ PFV      Full                                                        +---------+---------------+---------+-----------+----------+--------------+ POP      Full           Yes      Yes                                 +---------+---------------+---------+-----------+----------+--------------+ PTV      Full                                                        +---------+---------------+---------+-----------+----------+--------------+ PERO     Full                                                        +---------+---------------+---------+-----------+----------+--------------+   +---------+---------------+---------+-----------+----------+--------------+  LEFT     CompressibilityPhasicitySpontaneityPropertiesThrombus Aging +---------+---------------+---------+-----------+----------+--------------+ CFV      Full           Yes      Yes                                  +---------+---------------+---------+-----------+----------+--------------+ SFJ      Full                                                        +---------+---------------+---------+-----------+----------+--------------+ FV Prox  Full                                                        +---------+---------------+---------+-----------+----------+--------------+ FV Mid   Full                                                        +---------+---------------+---------+-----------+----------+--------------+ FV DistalFull                                                        +---------+---------------+---------+-----------+----------+--------------+ PFV      Full                                                        +---------+---------------+---------+-----------+----------+--------------+ POP      Full           Yes      Yes                                 +---------+---------------+---------+-----------+----------+--------------+ PTV      Full                                                        +---------+---------------+---------+-----------+----------+--------------+ PERO     Full                                                        +---------+---------------+---------+-----------+----------+--------------+     Summary: BILATERAL: - No evidence of deep vein thrombosis seen in the lower extremities, bilaterally. -No evidence of popliteal cyst, bilaterally.   *See table(s) above for measurements and observations. Electronically signed by Waverly Ferrari MD on 09/11/2020 at 5:13:41 PM.  Final    VAS [place] UPPER EXTREMITY VENOUS DUPLEX  Result Date: 09/11/2020 UPPER VENOUS STUDY  Patient Name:  Ryan Velasquez  Date of Exam:   09/11/2020 Medical Rec #: [id number]   Accession #:    [id number] Date of Birth: 08/26/1970   Patient Gender: M Patient Age:   49Y Exam Location:  [facility] Procedure:      VAS [place] UPPER EXTREMITY VENOUS DUPLEX Referring  Phys: [id number] Zay Yeargan --------------------------------------------------------------------------------  Indications: leukocytosis of unclear etiology Comparison Study: no prior Performing Technologist: [place] Ponder RVS  Examination Guidelines: A complete evaluation includes B-mode imaging, spectral Doppler, color Doppler, and power Doppler as needed of all accessible portions of each vessel. Bilateral testing is considered an integral part of a complete examination. Limited examinations for reoccurring indications may be performed as noted.  Right Findings: +----------+------------+---------+-----------+----------+-----------------+ RIGHT     CompressiblePhasicitySpontaneousProperties     Summary      +----------+------------+---------+-----------+----------+-----------------+ IJV           Full       Yes       Yes                                +----------+------------+---------+-----------+----------+-----------------+ Subclavian    Full       Yes       Yes                                +----------+------------+---------+-----------+----------+-----------------+ Axillary      Full       Yes       Yes                                +----------+------------+---------+-----------+----------+-----------------+ Brachial      Full       Yes       Yes                                +----------+------------+---------+-----------+----------+-----------------+ Radial        Full                                                    +----------+------------+---------+-----------+----------+-----------------+ Ulnar         Full                                                    +----------+------------+---------+-----------+----------+-----------------+ Cephalic      Full                                                    +----------+------------+---------+-----------+----------+-----------------+ Basilic       None  Age  Indeterminate +----------+------------+---------+-----------+----------+-----------------+  Left Findings: +----------+------------+---------+-----------+----------+-------+ LEFT      CompressiblePhasicitySpontaneousPropertiesSummary +----------+------------+---------+-----------+----------+-------+ IJV           Full       Yes       Yes                      +----------+------------+---------+-----------+----------+-------+ Subclavian    Full       Yes       Yes                      +----------+------------+---------+-----------+----------+-------+ Axillary      Full       Yes       Yes                      +----------+------------+---------+-----------+----------+-------+ Brachial      Full       Yes       Yes                      +----------+------------+---------+-----------+----------+-------+ Radial        Full                                          +----------+------------+---------+-----------+----------+-------+ Ulnar         Full                                          +----------+------------+---------+-----------+----------+-------+ Cephalic      Full                                          +----------+------------+---------+-----------+----------+-------+ Basilic       Full                                          +----------+------------+---------+-----------+----------+-------+  Summary:  Right: No evidence of deep vein thrombosis in the upper extremity. Findings consistent with age indeterminate superficial vein thrombosis involving the right basilic vein.  Left: No evidence of deep vein thrombosis in the upper extremity. No evidence of superficial vein thrombosis in the upper extremity. No evidence of thrombosis in the subclavian.  *See table(s) above for measurements and observations.  Diagnosing physician: Waverly Ferrari MD Electronically signed by Waverly Ferrari MD on 09/11/2020 at 5:13:56 PM.    Final      Anti-infectives: Anti-infectives (From admission, onward)    Start     Dose/Rate Route Frequency Ordered Stop   08/28/20 2315  piperacillin-tazobactam (ZOSYN) IVPB 3.375 g  Status:  Discontinued        3.375 g 12.5 mL/hr over 240 Minutes Intravenous Every 8 hours 08/27/20 2308 09/06/20 0956   08/27/20 1515  piperacillin-tazobactam (ZOSYN) IVPB 3.375 g        3.375 g 12.5 mL/hr over 240 Minutes Intravenous  Once 08/27/20 1507 08/28/20 1901        Assessment/Plan POD 14 s/p Exploratory laparotomy, 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 Fredricka Bonine 08/29/20 - Pathology  without malignancy - CT 7/12, 7/18 w/ ileus. No IAA - WBC down to 19K - JP drain output serous and minimal 10cc/24hrs - BID WTD for midline - Mobilize - Pulm toilet -add reglan for ileus -starting putting a lot out in colostomy and will in NGT.  Monitor fluid shifts and replace as needed.   -appreciate pharmacy assistance - WOC has seen and HH ordered. He has performed ostomy pouch changes independently. He reports his sister will help at discharge - hypokalemia - IV repletion has been ordered and in fluids as well.  BMET in am -patient has had a chronic n/v problem for over 8 months now.  This is almost certainly compounding the difficulty we are having trying to get his NGT out and getting his stomach and SB to work.  May need GI evaluation at some point for dysmotility or EGD evaluation .   FEN: NPO, TNA full rate, hypokalemia, replaced by pharmacy today ID: zosyn 7/4>7/14 VTE: SCDs, Lovenox Foley: removed 7/7, voiding   Tobacco use - nicotine patch   HTN - stop norvasc with reinsertion of NGT, change to lopressor  QID Chronic N/V - followed by GI as outpatient ETOH abuse - was drinking 1/2-1 pint/day  LOS: 16 days    Letha Cape, [facility] 09/12/2020, 8:24 AM Please see Amion for pager number during day hours  7:00am-4:30pm

## 2020-09-12 NOTE — Progress Notes (Signed)
Nutrition Follow-up  DOCUMENTATION CODES:  Severe malnutrition in context of chronic illness  INTERVENTION:  Continue TPN per Pharmacy until pt tolerates soft diet.  Continue to advance diet as medically able and as tolerated.  Add Boost Plus po TID, each supplement provides 360 kcal and 14 grams of protein.  Continue to monitor K, Mag, and Phos and replete as needed.  NUTRITION DIAGNOSIS:  Severe Malnutrition related to chronic illness as evidenced by severe muscle depletion, energy intake < or equal to 75% for > or equal to 1 month, percent weight loss. - ongoing  GOAL:  Patient will meet greater than or equal to 90% of their needs - met with TPN  MONITOR:  PO intake, Supplement acceptance, Diet advancement, Labs, Weight trends, Skin, I & O's  REASON FOR ASSESSMENT:  Consult New TPN/TNA  ASSESSMENT:  50 yo male with a PMH of EtOH abuse and HTN who presents with colonic obstruction. 7/11 - emesis (green) x1 7/12 - CT showed ileus 7/17 - advanced to clear liquids 7/18 - advanced to full liquids then made NPO; emesis (800 ml) 7/20 - advanced back to full liquids   Pt with high ostomy output (>500 ml) despite NGT. Pt trying Reglan with meals.  NGT removed on 7/16 and was replaced on 7/19.   NGT output: 1150 ml so far today 7/19 NGT output: 2900 ml  Admit wt: 74.8 kg Current wt: 68.9 kg Pt lost weight during admission.  Recommend continuing TPN per Pharmacy - continue until pt tolerates soft diet. RD to add Boost Plus TID given pt is on full liquids to attempt to start meeting needs PO.  Medications: reviewed; Reglan TID, Protonix, NaCl with K-Cl 40 mEq @ 75 ml/hr via IV, Dilaudid PRN (given 3 times today), oxycodone PRN (given once today)  Labs: reviewed; K 3.4 (L), Glucose 110 (H)  Diet Order:   Diet Order             Diet full liquid Room service appropriate? Yes; Fluid consistency: Thin  Diet effective now                  EDUCATION NEEDS:  Education  needs have been addressed  Skin:  Skin Assessment: Skin Integrity Issues: Skin Integrity Issues:: Incisions Incisions: Abdomen, closed  Last BM:  09/12/20 - colostomy (225 ml so far today); 7/19 - 2675 ml  Height:  Ht Readings from Last 1 Encounters:  08/27/20 5' 8" (1.727 m)   Weight:  Wt Readings from Last 1 Encounters:  09/12/20 68.9 kg   BMI:  Body mass index is 23.08 kg/m.  Estimated Nutritional Needs:  Kcal:  2150-2350 Protein:  100-115 grams Fluid:  >2 L  Megan Tincher, RD, LDN (she/her/hers) Registered Dietitian I After-Hours/Weekend Pager # in [phone] 

## 2020-09-12 NOTE — Progress Notes (Signed)
PHARMACY - TOTAL PARENTERAL NUTRITION CONSULT NOTE  Indication: Prolonged ileus  Patient Measurements: Height: 5\' 8"  (172.7 cm) Weight: 74 kg (163 lb 2.3 oz) IBW/kg (Calculated) : 68.4 TPN AdjBW (KG): 74.8 Body mass index is 24.81 kg/m. Usual Weight: 93 kg  Assessment:  [name] presented on 7/4 with N/V and abdominal pain x 2 days following constipation.  Found to have colonic obstruction with mass and abscess requiring ex-lap with sigmoid colectomy and end colostomy on 7/6.  Patient has nausea and vomiting post-op and was unable to start a diet. CT on 7/12 showed post-op ileus.  Pharmacy consulted for TPN management given inadequate intake for ~12 days.  Patient reports having Covid in Dec 2021 and intake has been reduced since.  He lost ~40 lbs in 7 months.  Patient is at risk for refeeding.  Glucose / Insulin: no hx DM - CBGs < 180 on BMET.  SSI/CBG checks D/C'ed 7/16.  Electrolytes: previous significant NG losses - K 3.4 post 5 runs (goal >/= 4), others WNL Renal: SCr < 1, BUN WNL Hepatic: LFTs / tbili WNL, prealbumin 11.8>>13.5, albumin 2.4, TG 147 Intake / Output; MIVF: UOP not documented, NG 8/16, JP drain 22mL, colostomy 9m.  NS 40K at 75 ml/hr Pertinent GI Imaging:  7/12 CT abd - post-op ileus  7/12 Xray - gastric distention  7/16 Abx XR - mildly progressive SB ileus or partial obstruction GI Surgeries / Procedures:  7/6 ex-lap with sigmoid colectomy and end colostomy  Central access: PICC placed 09/05/20 TPN start date: 09/05/20  Nutritional Goals (per RD rec on 7/13): 2150-2350 kCal, 100-115g AA, >2L fluid per day  Current Nutrition:  TPN (s/p 7d thiamine/folate in TPN) FLD > NPO on 7/19 d/t vomiting  Plan:  Continue TPN at goal rate 85 ml/hr to provide 105g AA, 325g CHO and 65g ILE for a total of 2182 kCal, meeting 100% of patient's needs Electrolytes in TPN: Na 135mEq/L, K 71mEq/L (= 83mEq/d), Ca 42mEq/L, Mag 61mEq/L, Phos 68mmol/L, Cl:Ac 1:1 Add standard MVI  and trace elements to TPN KCL x 4 runs NS 40K at 75 ml/hr (= 12m K per day) F/U AM labs, NG output (previously required up to K per day with 9L output)  Julian Medina D. , PharmD, BCPS, BCCCP 09/12/2020, 7:51 AM

## 2020-09-12 NOTE — H&P (View-Only) (Signed)
 Referring Provider: Dr. Matthew Tsuei  Primary Care Physician:  Pcp, No Primary Gastroenterologist:  Dr. Randy Peters at [facility].    Reason for Consultation:  Re-Consult for N/V  HPI: Ryan Velasquez is a 49 y.o. male tension, alcohol use disorder and diverticulitis.  He was admitted to the hospital on 08/27/2020 secondary to having N/V, abdominal pain and constipation.  An abdominal/pelvic CT scan identified a distal colonic obstruction with a 4.7 cm peridiverticular abscess.  He was evaluated by Dr.  Perry  and Sarrah Gribbin PA-C from our GI service at that time for consideration for a flexible sigmoidoscopy. Dr. Perry did not feel a sigmoidoscopy was necessary and he recommended for the patient to proceed with surgery. He subsequently underwent an exploratory laparotomy, sigmoid colectomy with end colostomy by Dr. Chelsea Connor on 08/29/2020.  Postoperatively, he was placed on TPN and he required an NG tube which was discontinued then replaced at least twice.  Approximately 7 to 10 days ago, he tried eating Jell-O and yogurt which he vomited up.  He continued to have nausea and vomiting despite large ostomy and NG tube tube output.  He reported having chronic nausea and vomiting for 8 months for which he saw Dr. Peters at [facility] plan to perform an EGD in the near future but was not done due to to this hospitalization. A GI consult was requested for further evaluation.  He reports having heartburn for which he took Omeprazole 20 mg daily for the past 3 months.  No dysphagia.  He initially developed nausea and vomiting 8 to 9 months ago which worsened after he was diagnosed with COVID-12 February 2020.  He initially vomited partially digested food several hours after eating twice weekly, after his COVID infection he was vomiting once every other day.  No hematemesis.  No upper or lower abdominal pain.  He takes ASA 81 mg once daily.  No other NSAID use.  He drinks 1 pint to 1/5 of liquor 5 to 7  days weekly.  However, he stopped drinking all alcohol 2 weeks prior to his hospital admission.  He was evaluated by GI Dr. Randy Peters at [facility] who prescribed Zofran which controlled his nausea and vomiting and he was scheduled for an EGD 09/25/2020.  He denies ever having an EGD or screening colonoscopy.  No known family history of esophageal, gastric or colon cancer.   Past Medical History:  Diagnosis Date   Alcohol abuse    Hypertension     Past Surgical History:  Procedure Laterality Date   BACK SURGERY     BACK SURGERY     L4-5 herniated disc   COLOSTOMY N/A 08/29/2020   Procedure: COLOSTOMY;  Surgeon: Connor, Chelsea A, MD;  Location: [facility] OR;  Service: General;  Laterality: N/A;   LAPAROTOMY N/A 08/29/2020   Procedure: EXPLORATORY LAPAROTOMY;  Surgeon: Connor, Chelsea A, MD;  Location: [facility] OR;  Service: General;  Laterality: N/A;   PARTIAL COLECTOMY N/A 08/29/2020   Procedure: PARTIAL SIGMOID COLECTOMY;  Surgeon: Connor, Chelsea A, MD;  Location: [facility] OR;  Service: General;  Laterality: N/A;    Prior to Admission medications   Medication Sig Start Date End Date Taking? Authorizing Provider  ALLERGY RELIEF 10 MG tablet Take 10 mg by mouth daily. 05/28/20  Yes [provider]  amLODipine (NORVASC) 5 MG tablet Take 5 mg by mouth daily. 05/28/20  Yes [provider]  bisacodyl (DULCOLAX) 10 MG suppository Place 10 mg rectally as needed   for moderate constipation.   Yes [provider]  bisacodyl 5 MG EC tablet Take 5 mg by mouth daily as needed for moderate constipation.   Yes [provider]  diazepam (VALIUM) 5 MG/ML solution Take 10 mg by mouth daily as needed (seizures). Give 10mg/2mL IM STAT and call on call medical staff. May repeat dose one time if seizure persists greater than 30 seconds after the first dose given.   Yes [provider]  docusate sodium (COLACE) 100 MG capsule Take 100 mg by mouth daily as needed for mild constipation.    Yes [provider]  losartan (COZAAR) 25 MG tablet Take 25 mg by mouth 2 (two) times daily. 05/10/20  Yes [provider]  magnesium hydroxide (MILK OF MAGNESIA) 400 MG/5ML suspension Take 30 mLs by mouth daily as needed for mild constipation.   Yes [provider]  Multiple Vitamins-Minerals (MULTI COMPLETE) CAPS Take 1 capsule by mouth daily.   Yes [provider]  omeprazole (PRILOSEC) 20 MG capsule Take 20 mg by mouth daily. 08/15/20  Yes [provider]  ondansetron (ZOFRAN-ODT) 8 MG disintegrating tablet Take 8 mg by mouth every 8 (eight) hours as needed for nausea or vomiting.   Yes [provider]  thiamine 100 MG tablet Take 100 mg by mouth daily.   Yes [provider]  traZODone (DESYREL) 50 MG tablet Take 50 mg by mouth at bedtime as needed for sleep.   Yes [provider]  metoprolol tartrate (LOPRESSOR) 25 MG tablet Take 25 mg by mouth 2 (two) times daily. Patient not taking: Reported on 08/28/2020 03/22/20   [provider]    Current Facility-Administered Medications  Medication Dose Route Frequency Provider Last Rate Last Admin   0.9 %  sodium chloride infusion   Intravenous Continuous Kabrich, Martha H, PA-C   Stopped at 09/08/20 2023   0.9 % NaCl with KCl 40 mEq / L  infusion   Intravenous Continuous Robertson, Crystal S, RPH 75 mL/hr at 09/12/20 0700 Infusion Verify at 09/12/20 0700   acetaminophen (TYLENOL) tablet 1,000 mg  1,000 mg Oral Q6H PRN Kabrich, Martha H, PA-C   1,000 mg at 09/08/20 1712   alum & mag hydroxide-simeth (MAALOX/MYLANTA) 200-200-20 MG/5ML suspension 30 mL  30 mL Oral Q4H PRN Osborne, Kelly, PA-C   30 mL at 09/10/20 1556   amLODipine (NORVASC) tablet 5 mg  5 mg Oral Daily Maczis, Michael M, PA-C   5 mg at 09/12/20 0940   Chlorhexidine Gluconate Cloth 2 % PADS 6 each  6 each Topical Daily Maczis, Michael M, PA-C   6 each at 09/12/20 0944   diazepam (VALIUM) 5 MG/ML concentrated  oral solution 10 mg  10 mg Oral Daily PRN Maczis, Michael M, PA-C       diphenhydrAMINE (BENADRYL) 12.5 MG/5ML elixir 12.5 mg  12.5 mg Oral Q6H PRN Maczis, Michael M, PA-C       Or   diphenhydrAMINE (BENADRYL) injection 12.5 mg  12.5 mg Intravenous Q6H PRN Maczis, Michael M, PA-C   12.5 mg at 09/06/20 2115   enoxaparin (LOVENOX) injection 40 mg  40 mg Subcutaneous Q24H Maczis, Michael M, PA-C   40 mg at 09/12/20 0940   guaiFENesin (ROBITUSSIN) 100 MG/5ML solution 100 mg  5 mL Oral Q6H PRN Osborne, Kelly, PA-C       HYDROmorphone (DILAUDID) injection 0.5-1 mg  0.5-1 mg Intravenous Q4H PRN Kabrich, Martha H, PA-C   1 mg at 09/12/20 1202     labetalol (NORMODYNE) injection 10 mg  10 mg Intravenous Q2H PRN Wakefield, Matthew, MD       lactose free nutrition (BOOST PLUS) liquid 237 mL  237 mL Oral TID WC Osborne, Kelly, PA-C       menthol-cetylpyridinium (CEPACOL) lozenge 3 mg  1 lozenge Oral PRN Wakefield, Matthew, MD       methocarbamol (ROBAXIN) 1,000 mg in dextrose 5 % 100 mL IVPB  1,000 mg Intravenous Q6H Kabrich, Martha H, PA-C 200 mL/hr at 09/12/20 0800 1,000 mg at 09/12/20 0800   metoCLOPramide (REGLAN) injection 5 mg  5 mg Intravenous Q8H Osborne, Kelly, PA-C   5 mg at 09/12/20 0609   metoprolol tartrate (LOPRESSOR) injection 5 mg  5 mg Intravenous Q6H Osborne, Kelly, PA-C   5 mg at 09/12/20 1156   nicotine (NICODERM CQ - dosed in mg/24 hours) patch 14 mg  14 mg Transdermal Daily Maczis, Michael M, PA-C   14 mg at 09/12/20 0940   ondansetron (ZOFRAN) injection 4 mg  4 mg Intravenous Q6H PRN Maczis, Michael M, PA-C   4 mg at 09/10/20 1435   oxyCODONE (Oxy IR/ROXICODONE) immediate release tablet 5-10 mg  5-10 mg Oral Q4H PRN Kabrich, Martha H, PA-C   5 mg at 09/12/20 0230   pantoprazole (PROTONIX) injection 40 mg  40 mg Intravenous QHS Maczis, Michael M, PA-C   40 mg at 09/11/20 2152   phenol (CHLORASEPTIC) mouth spray 1 spray  1 spray Mouth/Throat PRN Kabrich, Martha H, PA-C   1 spray at 09/07/20  2050   prochlorperazine (COMPAZINE) tablet 10 mg  10 mg Oral Q6H PRN Maczis, Michael M, PA-C       Or   prochlorperazine (COMPAZINE) injection 5-10 mg  5-10 mg Intravenous Q6H PRN Maczis, Michael M, PA-C   10 mg at 09/02/20 0742   silver nitrate applicators applicator 1 application  1 application Topical PRN Maczis, Michael M, PA-C       simethicone (MYLICON) chewable tablet 80 mg  80 mg Oral QID PRN Maczis, Michael M, PA-C   80 mg at 08/28/20 1538   sodium chloride flush (NS) 0.9 % injection 10-40 mL  10-40 mL Intracatheter Q12H Maczis, Michael M, PA-C   10 mL at 09/09/20 2157   sodium chloride flush (NS) 0.9 % injection 10-40 mL  10-40 mL Intracatheter PRN Maczis, Michael M, PA-C   10 mL at 09/07/20 1738   TPN ADULT (ION)   Intravenous Continuous TPN Robertson, Crystal S, RPH 85 mL/hr at 09/12/20 0700 Infusion Verify at 09/12/20 0700   TPN ADULT (ION)   Intravenous Continuous TPN Dang, Thuy D, RPH       traZODone (DESYREL) tablet 50 mg  50 mg Oral QHS PRN Maczis, Michael M, PA-C        Allergies as of 08/27/2020   (No Known Allergies)    History reviewed. No pertinent family history.  Social History   Socioeconomic History   Marital status: Single    Spouse name: Not on file   Number of children: Not on file   Years of education: Not on file   Highest education level: Not on file  Occupational History   Not on file  Tobacco Use   Smoking status: Every Day    Packs/day: 1.00    Years: 20.00    Pack years: 20.00    Types: Cigarettes   Smokeless tobacco: Current    Types: Snuff  Vaping Use   Vaping Use: Never used  Substance   and Sexual Activity   Alcohol use: Yes    Comment: last drink approximately 1 1/2 weeks ago, history of alcohol abuse   Drug use: Not on file   Sexual activity: Not on file  Other Topics Concern   Not on file  Social History Narrative   Not on file   Social Determinants of Health   Financial Resource Strain: Not on file  Food Insecurity: Not on  file  Transportation Needs: Not on file  Physical Activity: Not on file  Stress: Not on file  Social Connections: Not on file  Intimate Partner Violence: Not on file    Review of Systems: See HPI, all other systems reviewed and are negative  Physical Exam: Vital signs in last 24 hours: Temp:  [98 F (36.7 C)-98.4 F (36.9 C)] 98.2 F (36.8 C) (07/20 1142) Pulse Rate:  [85-100] 93 (07/20 1142) Resp:  [16-18] 16 (07/20 1142) BP: (114-143)/(68-96) 131/68 (07/20 1142) SpO2:  [97 %-100 %] 97 % (07/20 1142) Weight:  [68.9 kg] 68.9 kg (07/20 1142) Last BM Date: 09/12/20 General:  Alert 49-year-old male in no acute distress. Head:  Normocephalic and atraumatic. Eyes:  No scleral icterus. Conjunctiva pink. Ears:  Normal auditory acuity. Nose:  No deformity, discharge or lesions. Mouth:  Dentition intact. No ulcers or lesions.  Neck:  Supple. No lymphadenopathy or thyromegaly.  Lungs: Breath sounds clear throughout. Heart: Regular rate and rhythm, no murmurs. Abdomen: Soft, nondistended.  Nontender.  Midline incision dressing dry and intact.  RLQ JP drain with a small amount of serous drainage.  LUQ Rectal: Deferred. Musculoskeletal:  Symmetrical without gross deformities.  Pulses:  Normal pulses noted. Extremities:  Without clubbing or edema. Neurologic:  Alert and  oriented x4. No focal deficits.  Skin:  Intact without significant lesions or rashes. Psych:  Alert and cooperative. Normal mood and affect.  Intake/Output from previous day: 07/19 0701 - 07/20 0700 In: 3386.1 [I.V.:2675.8; NG/GT:60; IV Piggyback:650.3] Out: 3135 [Emesis/NG output:2900; Drains:10; Stool:225] Intake/Output this shift: Total I/O In: -  Out: 1150 [Emesis/NG output:1150]  Lab Results: Recent Labs    09/10/20 0311 09/11/20 0353 09/12/20 0433  WBC 22.0* 24.2* 19.7*  HGB 10.2* 9.9* 10.1*  HCT 30.9* 29.5* 30.6*  PLT 648* 576* 612*   BMET Recent Labs    09/10/20 0311 09/11/20 0353  09/12/20 0433  NA 136 130* 136  K 3.8 2.8* 3.4*  CL 104 96* 103  CO2 24 26 26  GLUCOSE 106* 116* 110*  BUN 12 8 8  CREATININE 0.48* 0.55* 0.52*  CALCIUM 8.7* 8.6* 8.7*   LFT Recent Labs    09/10/20 0311  PROT 5.9*  ALBUMIN 2.4*  AST 31  ALT 30  ALKPHOS 82  BILITOT 0.4   PT/INR No results for input(s): LABPROT, INR in the last 72 hours. Hepatitis Panel No results for input(s): HEPBSAG, HCVAB, HEPAIGM, HEPBIGM in the last 72 hours.    Studies/Results: DG Abd 1 View  Result Date: 09/11/2020 CLINICAL DATA:  Nasogastric tube placement post abdominal surgery on 08/29/2020 EXAM: ABDOMEN - 1 VIEW COMPARISON:  Portable exam 1004 hours compared to 09/08/2020 FINDINGS: Nasogastric tube coiled in distal esophagus. Persistent dilatation of small bowel loops in the abdomen. Gas also present in stomach and at distal transverse colon. Lung bases clear. IMPRESSION: Nasogastric tube coiled in distal esophagus; recommend withdrawal and replacement. Findings called to Jasmine RN on 09/11/2020 at 1148 hours. Electronically Signed   By: Mark  Boles M.D.   On: 09/11/2020 11:48     CT ABDOMEN PELVIS W CONTRAST  Result Date: 09/10/2020 CLINICAL DATA:  Abdominal distension Nausea/vomiting s/p Hartman's with prolonged ileus and leukocytosi EXAM: CT ABDOMEN AND PELVIS WITH CONTRAST TECHNIQUE: Multidetector CT imaging of the abdomen and pelvis was performed using the standard protocol following bolus administration of intravenous contrast. CONTRAST:  100mL OMNIPAQUE IOHEXOL 300 MG/ML  SOLN COMPARISON:  CT abdomen pelvis 09/04/2020 FINDINGS: Lower chest: Improving bilateral lower lobe tree-in-bud nodularity. Coronary artery calcifications. Hepatobiliary: No focal liver abnormality. No gallstones, gallbladder wall thickening, or pericholecystic fluid. No biliary dilatation. Pancreas: No focal lesion. Normal pancreatic contour. No surrounding inflammatory changes. No main pancreatic ductal dilatation. Spleen:  Normal in size without focal abnormality. Adrenals/Urinary Tract: No adrenal nodule bilaterally. Bilateral kidneys enhance symmetrically. No hydronephrosis. No hydroureter. The urinary bladder is unremarkable. Stomach/Bowel: Surgical changes related to Hartmann pouch formation and left lower lobe and colostomy formation. Gastric dilatation with fluid. Multiple loops of small bowel are distended with fluid measuring up to at least 5.5 cm on axial imaging. No definite transition point identified; however, the terminal ileum is again noted to be mostly decompressed with a similar region of change in caliber within the left lower abdomen that is poorly visualized (6: 60-75). No bowel wall thickening of the small bowel or pneumatosis. Gas and fluid noted within the large bowel. No large bowel wall thickening. Scattered colonic diverticulosis. Appendix appears normal. Vascular/Lymphatic: No abdominal aorta or iliac aneurysm. Severe atherosclerotic plaque of the aorta and its branches. No abdominal, pelvic, or inguinal lymphadenopathy. Reproductive: Prostate is unremarkable. Other: Right lower quadrant surgical drain with tip terminating in the left pelvis. No intraperitoneal free fluid. No intraperitoneal free gas. No organized fluid collection. Musculoskeletal: Midline anterior abdominal incision with persistent open soft tissue defect. No associated organized fluid collection or fat stranding to suggest infection. No suspicious lytic or blastic osseous lesions. No acute displaced fracture. Multilevel degenerative changes of the spine. IMPRESSION: 1. Similar-appearing fluid dilated stomach and small bowel with a mostly decompressed terminal ileum and a similar change in caliber of the small bowel within the left lower abdomen that is poorly visualized. Finding may represent a postop ileus versus a not excluded partial small bowel obstruction. Recommend enteric tube placement given gastric and proximal/mid small bowel  dilatation with fluid. Consider use of both IV and PO contrast in future imaging given difficulty of visualizing the small bowel change in caliber in the left lower abdomen. 2. 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 3. Scattered colonic diverticulosis with no acute diverticulitis. 4. Improving bilateral lower lobe tree-in-bud nodularity likely representing a resolving infection/inflammation of the lungs. 5.  Aortic Atherosclerosis (ICD10-I70.0). Electronically Signed   By: Morgane  Naveau M.D.   On: 09/10/2020 21:31   DG Abd Portable 1V  Result Date: 09/11/2020 CLINICAL DATA:  Nasogastric tube. EXAM: PORTABLE ABDOMEN - 1 VIEW COMPARISON:  Same day. FINDINGS: The bowel gas pattern is normal. No radio-opaque calculi or other significant radiographic abnormality are seen. Right-sided PICC line is unchanged in position. Distal tip of nasogastric tube is seen in proximal stomach. IMPRESSION: Distal tip of nasogastric tube seen in proximal stomach. Electronically Signed   By: James  Green Jr M.D.   On: 09/11/2020 20:40   VAS US LOWER EXTREMITY VENOUS (DVT)  Result Date: 09/11/2020  Lower Venous DVT Study Patient Name:  Muneeb Rod  Date of Exam:   09/11/2020 Medical Rec #: [id number]   Accession #:    [id number] Date of Birth: 07/24/1970     Patient Gender: M Patient Age:   49Y Exam Location:  [facility] Procedure:      VAS US LOWER EXTREMITY VENOUS (DVT) Referring Phys: [id number] KELLY OSBORNE --------------------------------------------------------------------------------  Indications: Leukocytosis of unclear etiology.  Comparison Study: no prior Performing Technologist: Megan Stricklin RVS  Examination Guidelines: A complete evaluation includes B-mode imaging, spectral Doppler, color Doppler, and power Doppler as needed of all accessible portions of each vessel. Bilateral testing is considered an integral part of a complete examination. Limited examinations for reoccurring  indications may be performed as noted. The reflux portion of the exam is performed with the patient in reverse Trendelenburg.  +---------+---------------+---------+-----------+----------+--------------+ RIGHT    CompressibilityPhasicitySpontaneityPropertiesThrombus Aging +---------+---------------+---------+-----------+----------+--------------+ CFV      Full           Yes      Yes                                 +---------+---------------+---------+-----------+----------+--------------+ SFJ      Full                                                        +---------+---------------+---------+-----------+----------+--------------+ FV Prox  Full                                                        +---------+---------------+---------+-----------+----------+--------------+ FV Mid   Full                                                        +---------+---------------+---------+-----------+----------+--------------+ FV DistalFull                                                        +---------+---------------+---------+-----------+----------+--------------+ PFV      Full                                                        +---------+---------------+---------+-----------+----------+--------------+ POP      Full           Yes      Yes                                 +---------+---------------+---------+-----------+----------+--------------+ PTV      Full                                                        +---------+---------------+---------+-----------+----------+--------------+   PERO     Full                                                        +---------+---------------+---------+-----------+----------+--------------+   +---------+---------------+---------+-----------+----------+--------------+ LEFT     CompressibilityPhasicitySpontaneityPropertiesThrombus Aging  +---------+---------------+---------+-----------+----------+--------------+ CFV      Full           Yes      Yes                                 +---------+---------------+---------+-----------+----------+--------------+ SFJ      Full                                                        +---------+---------------+---------+-----------+----------+--------------+ FV Prox  Full                                                        +---------+---------------+---------+-----------+----------+--------------+ FV Mid   Full                                                        +---------+---------------+---------+-----------+----------+--------------+ FV DistalFull                                                        +---------+---------------+---------+-----------+----------+--------------+ PFV      Full                                                        +---------+---------------+---------+-----------+----------+--------------+ POP      Full           Yes      Yes                                 +---------+---------------+---------+-----------+----------+--------------+ PTV      Full                                                        +---------+---------------+---------+-----------+----------+--------------+ PERO     Full                                                        +---------+---------------+---------+-----------+----------+--------------+       Summary: BILATERAL: - No evidence of deep vein thrombosis seen in the lower extremities, bilaterally. -No evidence of popliteal cyst, bilaterally.   *See table(s) above for measurements and observations. Electronically signed by Christopher Dickson MD on 09/11/2020 at 5:13:41 PM.    Final    VAS US UPPER EXTREMITY VENOUS DUPLEX  Result Date: 09/11/2020 UPPER VENOUS STUDY  Patient Name:  Jowan Fritze  Date of Exam:   09/11/2020 Medical Rec #: [id number]   Accession #:    [id number] Date of Birth:  09/29/1970   Patient Gender: M Patient Age:   49Y Exam Location:  [facility] Procedure:      VAS US UPPER EXTREMITY VENOUS DUPLEX Referring Phys: [id number] KELLY OSBORNE --------------------------------------------------------------------------------  Indications: leukocytosis of unclear etiology Comparison Study: no prior Performing Technologist: Megan Stricklin RVS  Examination Guidelines: A complete evaluation includes B-mode imaging, spectral Doppler, color Doppler, and power Doppler as needed of all accessible portions of each vessel. Bilateral testing is considered an integral part of a complete examination. Limited examinations for reoccurring indications may be performed as noted.  Right Findings: +----------+------------+---------+-----------+----------+-----------------+ RIGHT     CompressiblePhasicitySpontaneousProperties     Summary      +----------+------------+---------+-----------+----------+-----------------+ IJV           Full       Yes       Yes                                +----------+------------+---------+-----------+----------+-----------------+ Subclavian    Full       Yes       Yes                                +----------+------------+---------+-----------+----------+-----------------+ Axillary      Full       Yes       Yes                                +----------+------------+---------+-----------+----------+-----------------+ Brachial      Full       Yes       Yes                                +----------+------------+---------+-----------+----------+-----------------+ Radial        Full                                                    +----------+------------+---------+-----------+----------+-----------------+ Ulnar         Full                                                    +----------+------------+---------+-----------+----------+-----------------+ Cephalic      Full                                                     +----------+------------+---------+-----------+----------+-----------------+   Basilic       None                                  Age Indeterminate +----------+------------+---------+-----------+----------+-----------------+  Left Findings: +----------+------------+---------+-----------+----------+-------+ LEFT      CompressiblePhasicitySpontaneousPropertiesSummary +----------+------------+---------+-----------+----------+-------+ IJV           Full       Yes       Yes                      +----------+------------+---------+-----------+----------+-------+ Subclavian    Full       Yes       Yes                      +----------+------------+---------+-----------+----------+-------+ Axillary      Full       Yes       Yes                      +----------+------------+---------+-----------+----------+-------+ Brachial      Full       Yes       Yes                      +----------+------------+---------+-----------+----------+-------+ Radial        Full                                          +----------+------------+---------+-----------+----------+-------+ Ulnar         Full                                          +----------+------------+---------+-----------+----------+-------+ Cephalic      Full                                          +----------+------------+---------+-----------+----------+-------+ Basilic       Full                                          +----------+------------+---------+-----------+----------+-------+  Summary:  Right: No evidence of deep vein thrombosis in the upper extremity. Findings consistent with age indeterminate superficial vein thrombosis involving the right basilic vein.  Left: No evidence of deep vein thrombosis in the upper extremity. No evidence of superficial vein thrombosis in the upper extremity. No evidence of thrombosis in the subclavian.  *See table(s) above for measurements and observations.   Diagnosing physician: Christopher Dickson MD Electronically signed by Christopher Dickson MD on 09/11/2020 at 5:13:56 PM.    Final     IMPRESSION/PLAN:  1. 49 year old male GERD symptoms on PPI x 3 months with with chronic nausea and vomiting x 8 to 9 months.  NG tube to low intermittent suction. CTAP with contrast 7/18 showed similar appearing fluid dilated stomach and small bowel with a mostly decompressed TI, findings possibly representing postoperative ileus, small bowel partial obstruction could not be completely ruled out.  NG tube was reinserted.  -NPO after midnight -EGD 09/11/2020. EGD   benefits and risks discussed including risk with sedation, risk of bleeding, perforation and infection  -Continue Reglan 5 mg IV.  On -Continue ondansetron 4 mg IV every 6 hours as needed -Limit narcotic use -Further recommendations per Dr. Armbruster   2. 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  Alcohol use disorder.  Abstinent from alcohol x 5 weeks.    Cordia Miklos M Kennedy-Smith  09/12/2020, 2:17 PM      

## 2020-09-13 ENCOUNTER — Encounter (HOSPITAL_COMMUNITY): Payer: Self-pay

## 2020-09-13 ENCOUNTER — Encounter (HOSPITAL_COMMUNITY): Admission: EM | Disposition: A | Discharge status: Home Health | Payer: Self-pay | Source: Home / Self Care

## 2020-09-13 ENCOUNTER — Inpatient Hospital Stay (HOSPITAL_COMMUNITY): Payer: BC Managed Care – PPO | Admitting: Anesthesiology

## 2020-09-13 DIAGNOSIS — K2210 Ulcer of esophagus without bleeding: Secondary | ICD-10-CM | POA: Diagnosis not present

## 2020-09-13 DIAGNOSIS — R112 Nausea with vomiting, unspecified: Secondary | ICD-10-CM | POA: Diagnosis not present

## 2020-09-13 DIAGNOSIS — K3189 Other diseases of stomach and duodenum: Secondary | ICD-10-CM

## 2020-09-13 HISTORY — PX: ESOPHAGOGASTRODUODENOSCOPY (EGD) WITH PROPOFOL: SHX5813

## 2020-09-13 HISTORY — PX: BIOPSY: SHX5522

## 2020-09-13 LAB — COMPREHENSIVE METABOLIC PANEL
ALT: 29 U/L (ref 0–44)
AST: 19 U/L (ref 15–41)
Albumin: 2.2 g/dL — ABNORMAL LOW (ref 3.5–5.0)
Alkaline Phosphatase: 91 U/L (ref 38–126)
Anion gap: 7 (ref 5–15)
BUN: 7 mg/dL (ref 6–20)
CO2: 25 mmol/L (ref 22–32)
Calcium: 8.6 mg/dL — ABNORMAL LOW (ref 8.9–10.3)
Chloride: 103 mmol/L (ref 98–111)
Creatinine, Ser: 0.51 mg/dL — ABNORMAL LOW (ref 0.61–1.24)
GFR, Estimated: >60 mL/min (ref >60)
Glucose, Bld: 103 mg/dL — ABNORMAL HIGH (ref 70–99)
Potassium: 3.9 mmol/L (ref 3.5–5.1)
Sodium: 135 mmol/L (ref 135–145)
Total Bilirubin: 0.5 mg/dL (ref 0.3–1.2)
Total Protein: 5.8 g/dL — ABNORMAL LOW (ref 6.5–8.1)

## 2020-09-13 LAB — CBC
HCT: 29.8 % — ABNORMAL LOW (ref 39.0–52.0)
Hemoglobin: 9.8 g/dL — ABNORMAL LOW (ref 13.0–17.0)
MCH: 34.6 pg — ABNORMAL HIGH (ref 26.0–34.0)
MCHC: 32.9 g/dL (ref 30.0–36.0)
MCV: 105.3 fL — ABNORMAL HIGH (ref 80.0–100.0)
Platelets: 559 10*3/uL — ABNORMAL HIGH (ref 150–400)
RBC: 2.83 MIL/uL — ABNORMAL LOW (ref 4.22–5.81)
RDW: 15.1 % (ref 11.5–15.5)
WBC: 17.9 10*3/uL — ABNORMAL HIGH (ref 4.0–10.5)
nRBC: 0 % (ref 0.0–0.2)

## 2020-09-13 LAB — MAGNESIUM: Magnesium: 1.8 mg/dL (ref 1.7–2.4)

## 2020-09-13 LAB — PHOSPHORUS: Phosphorus: 3.5 mg/dL (ref 2.5–4.6)

## 2020-09-13 SURGERY — ESOPHAGOGASTRODUODENOSCOPY (EGD) WITH PROPOFOL
Anesthesia: Monitor Anesthesia Care | Laterality: Left

## 2020-09-13 MED ORDER — TRAVASOL 10 % IV SOLN
INTRAVENOUS | Status: AC
  Filled 2020-09-13: qty 1050.6

## 2020-09-13 MED ORDER — METOCLOPRAMIDE HCL 5 MG/ML IJ SOLN
10.0000 mg | Freq: Four times a day (QID) | INTRAMUSCULAR | Status: DC
  Administered 2020-09-13 – 2020-09-19 (×22): 10 mg via INTRAVENOUS
  Filled 2020-09-13 (×23): qty 2

## 2020-09-13 MED ORDER — PROPOFOL 1000 MG/100ML IV EMUL
INTRAVENOUS | Status: AC
  Filled 2020-09-13: qty 100

## 2020-09-13 MED ORDER — METOCLOPRAMIDE HCL 5 MG/ML IJ SOLN: 10.0000 mg | Freq: Three times a day (TID) | INTRAMUSCULAR | Status: DC

## 2020-09-13 MED ORDER — LIDOCAINE 2% (20 MG/ML) 5 ML SYRINGE
INTRAMUSCULAR | Status: DC | PRN
  Administered 2020-09-13: 100 mg via INTRAVENOUS

## 2020-09-13 MED ORDER — MAGNESIUM SULFATE 2 GM/50ML IV SOLN
2.0000 g | Freq: Once | INTRAVENOUS | Status: AC
  Administered 2020-09-13: 2 g via INTRAVENOUS
  Filled 2020-09-13: qty 50

## 2020-09-13 MED ORDER — ERYTHROMYCIN BASE 250 MG PO TABS
250.0000 mg | ORAL_TABLET | Freq: Three times a day (TID) | ORAL | Status: DC
  Administered 2020-09-13 – 2020-09-19 (×21): 250 mg via ORAL
  Filled 2020-09-13 (×28): qty 1

## 2020-09-13 MED ORDER — PROPOFOL 10 MG/ML IV BOLUS
INTRAVENOUS | Status: DC | PRN
  Administered 2020-09-13: 20 mg via INTRAVENOUS
  Administered 2020-09-13: 60 mg via INTRAVENOUS
  Administered 2020-09-13: 20 mg via INTRAVENOUS

## 2020-09-13 MED ORDER — PROPOFOL 500 MG/50ML IV EMUL
INTRAVENOUS | Status: DC | PRN
  Administered 2020-09-13: 100 ug/kg/min via INTRAVENOUS

## 2020-09-13 MED ORDER — LACTATED RINGERS IV SOLN: INTRAVENOUS | Status: DC | PRN

## 2020-09-13 SURGICAL SUPPLY — 15 items

## 2020-09-13 NOTE — Interval H&P Note (Signed)
History and Physical Interval Note:  09/13/2020 1:04 PM  Ryan Velasquez  has presented today for surgery, with the diagnosis of nausea / vomiting.  The various methods of treatment have been discussed with the patient and family. After consideration of risks, benefits and other options for treatment, the patient has consented to  Procedure(s): ESOPHAGOGASTRODUODENOSCOPY (EGD) WITH PROPOFOL (Left) as a surgical intervention.  The patient's history has been reviewed, patient examined, no change in status, stable for surgery.  I have reviewed the patient's chart and labs.  Questions were answered to the patient's satisfaction.     [employer]

## 2020-09-13 NOTE — Op Note (Addendum)
[facility] Patient Name: Ryan Velasquez Procedure Date : 09/13/2020 MRN: [id number] Attending MD: Justice Britain , MD Date of Birth: 07-24-1970 CSN: [id number] Age: 50 Admit Type: Inpatient Procedure:                Upper GI endoscopy Indications:              Heartburn, Nausea with vomiting Providers:                Justice Britain, MD, Baird Cancer, RN, Hinton Dyer, Ladona Ridgel, Technician, Elmer Sow Referring MD:             Carlota Raspberry. Havery Moros, MD, Poyen,                            MD Medicines:                Monitored Anesthesia Care Complications:            No immediate complications. Estimated Blood Loss:     Estimated blood loss was minimal. Procedure:                Pre-Anesthesia Assessment:                           - Prior to the procedure, a History and Physical                            was performed, and patient medications and                            allergies were reviewed. The patient's tolerance of                            previous anesthesia was also reviewed. The risks                            and benefits of the procedure and the sedation                            options and risks were discussed with the patient.                            All questions were answered, and informed consent                            was obtained. Prior Anticoagulants: The patient has                            taken Lovenox (enoxaparin), last dose was day of                            procedure. ASA Grade Assessment: III - A patient  with severe systemic disease. After reviewing the                            risks and benefits, the patient was deemed in                            satisfactory condition to undergo the procedure.                           After obtaining informed consent, the endoscope was                            passed under direct vision. Throughout the                             procedure, the patient's blood pressure, pulse, and                            oxygen saturations were monitored continuously. The                            [id number]-H190 ([id number]) Olympus endoscope was introduced                            through the mouth, and advanced to the third part                            of duodenum. The upper GI endoscopy was                            accomplished without difficulty. The patient                            tolerated the procedure. Scope In: Scope Out: Findings:      No gross lesions were noted in the proximal esophagus.      Many small non-bleeding erosions were found in the mid esophagus and in       the distal esophagus. Biopsies were taken with a cold forceps for       histology to rule out Candida and viral esophagitis.      A 3 cm hiatal hernia was present.      Retained fluid was found in the entire examined stomach. Suction via       Endoscope was performed with >1000 mL removed).      A distention deformity was found in the entire examined stomach but no       evidence of a structural blockage was found.      Patchy mildly erythematous mucosa without bleeding was found in the       entire examined stomach. Biopsies were taken with a cold forceps for       histology and Helicobacter pylori testing.      No gross mucosal lesions were noted in the duodenal bulb, in the first       portion of the duodenum, in the second portion of the duodenum and in       the third  portion of the duodenum. Biopsies were taken with a cold       forceps for histology.      Due to the degree of retained fluids found, I decided to replace the NGT       while under sedation in case it were to be needed. A 16 Fr nasogastric       tube was placed through the nares into the esophagus. Under endoscopic       guidance, the tube was advanced into the stomach. Placement was       confirmed by scope visualization. Impression:               -  No gross lesions in esophagus proximally.                           - Many non-bleeding erosions in the middle/distal                            esophagus. Biopsied.                           - 3 cm hiatal hernia.                           - Retained gastric fluid (>1000 mL removed).                           - Distention deformity of the entire stomach.                           - Erythematous mucosa in the stomach. Biopsied.                           - No gross mucosal lesions in the duodenal bulb, in                            the first portion of the duodenum, in the second                            portion of the duodenum and in the third portion of                            the duodenum. Biopsied.                           - NGT replaced, at end of procedure in case it will                            be required in future for decompression again. Recommendation:           - The patient will be observed post-procedure,                            until all discharge criteria are met.                           -  Return patient to hospital ward for ongoing care.                           - May remain on clampped NGT trial if patient                            desires.                           - Clear liquid diet can be trialed as he tolerated                            2 meals in the last 24 hours, but the degree of                            distention and amount of fluid removed, make me                            concerned he could have issues, but time will tell.                           - Observe patient's clinical course.                           - Increase Protonix to 40 mg IV BID until able to                            take PO consistently.                           - May continue Reglan for now [facility] but could                            increase to Q6H, but if this is done, will need to                            monitor and evaluate QTc on EKG. Recommend K>4.0                             and Mg>2.0 (so check daily and replete if                            necessary).                           - Await pathology results.                           - Hopefully, patient will have continued                            improvement and ability to tolerate oral intake  over the coming days with improving post-operative                            ileus and such. The degree of dilation of the                            stomach however, suggests that with his                            longer-standing symptoms, it is possible he could                            have underlying Gastroparesis, so as an outpatient                            his [facility] team should consider this workup.                           - GI will signoff for now and update when pathology                            returns. Please call Inpatient team if other issues                            develop.                           - The findings and recommendations were discussed                            with the patient. Procedure Code(s):        --- Professional ---                           [phone], Esophagogastroduodenoscopy, flexible,                            transoral; with insertion of intraluminal tube or                            catheter                           43239, Esophagogastroduodenoscopy, flexible,                            transoral; with biopsy, single or multiple Diagnosis Code(s):        --- Professional ---                           K22.10, Ulcer of esophagus without bleeding                           K31.89, Other diseases of stomach and duodenum  R12, Heartburn                           R11.2, Nausea with vomiting, unspecified CPT copyright 2019 American Medical Association. All rights reserved. The codes documented in this report are preliminary and upon coder review may  be revised to meet current compliance  requirements. Justice Britain, MD 09/13/2020 2:10:41 PM Number of Addenda: 0

## 2020-09-13 NOTE — Progress Notes (Signed)
Progress Note  15 Days Post-Op  Subjective: No new complaints.  NGT clamped and no vomiting.  Has taken in some clear liquids.  Ostomy still working well   Objective: Vital signs in last 24 hours: Temp:  [98.2 F (36.8 C)-98.9 F (37.2 C)] 98.9 F (37.2 C) (07/21 0755) Pulse Rate:  [91-99] 99 (07/21 0755) Resp:  [16-18] 18 (07/21 0755) BP: (131-159)/(68-82) 159/81 (07/21 0755) SpO2:  [97 %-99 %] 97 % (07/21 0755) Weight:  [68.9 kg] 68.9 kg (07/20 1142) Last BM Date: 09/12/20  Intake/Output from previous day: 07/20 0701 - 07/21 0700 In: 4488.6 [P.O.:290; I.V.:3598.6; IV Piggyback:600] Out: 3882 [Urine:350; Emesis/NG output:2250; Drains:7; Stool:1275] Intake/Output this shift: Total I/O In: -  Out: 200 [Stool:200]  PE: Abd: still soft, but slightly more distended today with NGT clamped, minimal tenderness, NGT clamped.  Midline stable, JP with minimal serous output.  Colostomy with viable stoma, and 1200cc of output yesterday.   Lab Results:  Recent Labs    09/12/20 0433 09/13/20 0439  WBC 19.7* 17.9*  HGB 10.1* 9.8*  HCT 30.6* 29.8*  PLT 612* 559*   BMET Recent Labs    09/12/20 0433 09/13/20 0439  NA 136 135  K 3.4* 3.9  CL 103 103  CO2 26 25  GLUCOSE 110* 103*  BUN 8 7  CREATININE 0.52* 0.51*  CALCIUM 8.7* 8.6*   PT/INR No results for input(s): LABPROT, INR in the last 72 hours. CMP     Component Value Date/Time   NA 135 09/13/2020 0439   K 3.9 09/13/2020 0439   CL 103 09/13/2020 0439   CO2 25 09/13/2020 0439   GLUCOSE 103 (H) 09/13/2020 0439   BUN 7 09/13/2020 0439   CREATININE 0.51 (L) 09/13/2020 0439   CALCIUM 8.6 (L) 09/13/2020 0439   PROT 5.8 (L) 09/13/2020 0439   ALBUMIN 2.2 (L) 09/13/2020 0439   AST 19 09/13/2020 0439   ALT 29 09/13/2020 0439   ALKPHOS 91 09/13/2020 0439   BILITOT 0.5 09/13/2020 0439   GFRNONAA >60 09/13/2020 0439   Lipase     Component Value Date/Time   LIPASE 25 08/27/2020 1125        Studies/Results: DG Abd 1 View  Result Date: 09/11/2020 CLINICAL DATA:  Nasogastric tube placement post abdominal surgery on 08/29/2020 EXAM: ABDOMEN - 1 VIEW COMPARISON:  Portable exam 1004 hours compared to 09/08/2020 FINDINGS: Nasogastric tube coiled in distal esophagus. Persistent dilatation of small bowel loops in the abdomen. Gas also present in stomach and at distal transverse colon. Lung bases clear. IMPRESSION: Nasogastric tube coiled in distal esophagus; recommend withdrawal and replacement. Findings called to [facility] RN on 09/11/2020 at 1148 hours. Electronically Signed   By: Ulyses Southward M.D.   On: 09/11/2020 11:48   DG Abd Portable 1V  Result Date: 09/11/2020 CLINICAL DATA:  Nasogastric tube. EXAM: PORTABLE ABDOMEN - 1 VIEW COMPARISON:  Same day. FINDINGS: The bowel gas pattern is normal. No radio-opaque calculi or other significant radiographic abnormality are seen. Right-sided PICC line is unchanged in position. Distal tip of nasogastric tube is seen in proximal stomach. IMPRESSION: Distal tip of nasogastric tube seen in proximal stomach. Electronically Signed   By: Lupita Raider M.D.   On: 09/11/2020 20:40   VAS [place] LOWER EXTREMITY VENOUS (DVT)  Result Date: 09/11/2020  Lower Venous DVT Study Patient Name:  Ryan Velasquez  Date of Exam:   09/11/2020 Medical Rec #: [id number]   Accession #:    [id number] Date  of Birth: 07-31-1970   Patient Gender: M Patient Age:   [age]Y [facility]  Procedure:      VAS US LOWER EXTREMITY VENOUS (DVT) Referring Phys: [id number] Arelyn Gauer --------------------------------------------------------------------------------  Indications: Leukocytosis of unclear etiology.  Comparison Study: no prior Performing Technologist: [place] PonderMegan Stricklin RVS  Examination Guidelines: A complete evaluation includes B-mode imaging, spectral Doppler, color Doppler, and power Doppler as needed of all accessible portions of each vessel. Bilateral testing is  considered an integral part of a complete examination. Limited examinations for reoccurring indications may be performed as noted. The reflux portion of the exam is performed with the patient in reverse Trendelenburg.  +---------+---------------+---------+-----------+----------+--------------+ RIGHT    CompressibilityPhasicitySpontaneityPropertiesThrombus Aging +---------+---------------+---------+-----------+----------+--------------+ CFV      Full           Yes      Yes                                 +---------+---------------+---------+-----------+----------+--------------+ SFJ      Full                                                        +---------+---------------+---------+-----------+----------+--------------+ FV Prox  Full                                                        +---------+---------------+---------+-----------+----------+--------------+ FV Mid   Full                                                        +---------+---------------+---------+-----------+----------+--------------+ FV DistalFull                                                        +---------+---------------+---------+-----------+----------+--------------+ PFV      Full                                                        +---------+---------------+---------+-----------+----------+--------------+ POP      Full           Yes      Yes                                 +---------+---------------+---------+-----------+----------+--------------+ PTV      Full                                                        +---------+---------------+---------+-----------+----------+--------------+  PERO     Full                                                        +---------+---------------+---------+-----------+----------+--------------+   +---------+---------------+---------+-----------+----------+--------------+ LEFT      CompressibilityPhasicitySpontaneityPropertiesThrombus Aging +---------+---------------+---------+-----------+----------+--------------+ CFV      Full           Yes      Yes                                 +---------+---------------+---------+-----------+----------+--------------+ SFJ      Full                                                        +---------+---------------+---------+-----------+----------+--------------+ FV Prox  Full                                                        +---------+---------------+---------+-----------+----------+--------------+ FV Mid   Full                                                        +---------+---------------+---------+-----------+----------+--------------+ FV DistalFull                                                        +---------+---------------+---------+-----------+----------+--------------+ PFV      Full                                                        +---------+---------------+---------+-----------+----------+--------------+ POP      Full           Yes      Yes                                 +---------+---------------+---------+-----------+----------+--------------+ PTV      Full                                                        +---------+---------------+---------+-----------+----------+--------------+ PERO     Full                                                        +---------+---------------+---------+-----------+----------+--------------+       Summary: BILATERAL: - No evidence of deep vein thrombosis seen in the lower extremities, bilaterally. -No evidence of popliteal cyst, bilaterally.   *See table(s) above for measurements and observations. Electronically signed by Waverly Ferrari MD on 09/11/2020 at 5:13:41 PM.    Final    VAS [place] UPPER EXTREMITY VENOUS DUPLEX  Result Date: 09/11/2020 UPPER VENOUS STUDY  Patient Name:  Ryan Velasquez  Date of Exam:   09/11/2020  Medical Rec #: [id number]   Accession #:    [id number] Date of Birth: 01-Mar-1970   Patient Gender: M Patient Age:   49Y Exam Location:  [facility] Procedure:      VAS [place] UPPER EXTREMITY VENOUS DUPLEX Referring Phys: [id number] Marcelus Dubberly --------------------------------------------------------------------------------  Indications: leukocytosis of unclear etiology Comparison Study: no prior Performing Technologist: [place] Ponder RVS  Examination Guidelines: A complete evaluation includes B-mode imaging, spectral Doppler, color Doppler, and power Doppler as needed of all accessible portions of each vessel. Bilateral testing is considered an integral part of a complete examination. Limited examinations for reoccurring indications may be performed as noted.  Right Findings: +----------+------------+---------+-----------+----------+-----------------+ RIGHT     CompressiblePhasicitySpontaneousProperties     Summary      +----------+------------+---------+-----------+----------+-----------------+ IJV           Full       Yes       Yes                                +----------+------------+---------+-----------+----------+-----------------+ Subclavian    Full       Yes       Yes                                +----------+------------+---------+-----------+----------+-----------------+ Axillary      Full       Yes       Yes                                +----------+------------+---------+-----------+----------+-----------------+ Brachial      Full       Yes       Yes                                +----------+------------+---------+-----------+----------+-----------------+ Radial        Full                                                    +----------+------------+---------+-----------+----------+-----------------+ Ulnar         Full                                                    +----------+------------+---------+-----------+----------+-----------------+  Cephalic      Full                                                    +----------+------------+---------+-----------+----------+-----------------+  Basilic       None                                  Age Indeterminate +----------+------------+---------+-----------+----------+-----------------+  Left Findings: +----------+------------+---------+-----------+----------+-------+ LEFT      CompressiblePhasicitySpontaneousPropertiesSummary +----------+------------+---------+-----------+----------+-------+ IJV           Full       Yes       Yes                      +----------+------------+---------+-----------+----------+-------+ Subclavian    Full       Yes       Yes                      +----------+------------+---------+-----------+----------+-------+ Axillary      Full       Yes       Yes                      +----------+------------+---------+-----------+----------+-------+ Brachial      Full       Yes       Yes                      +----------+------------+---------+-----------+----------+-------+ Radial        Full                                          +----------+------------+---------+-----------+----------+-------+ Ulnar         Full                                          +----------+------------+---------+-----------+----------+-------+ Cephalic      Full                                          +----------+------------+---------+-----------+----------+-------+ Basilic       Full                                          +----------+------------+---------+-----------+----------+-------+  Summary:  Right: No evidence of deep vein thrombosis in the upper extremity. Findings consistent with age indeterminate superficial vein thrombosis involving the right basilic vein.  Left: No evidence of deep vein thrombosis in the upper extremity. No evidence of superficial vein thrombosis in the upper extremity. No evidence of thrombosis in the  subclavian.  *See table(s) above for measurements and observations.  Diagnosing physician: Waverly Ferrari MD Electronically signed by Waverly Ferrari MD on 09/11/2020 at 5:13:56 PM.    Final     Anti-infectives: Anti-infectives (From admission, onward)    Start     Dose/Rate Route Frequency Ordered Stop   08/28/20 2315  piperacillin-tazobactam (ZOSYN) IVPB 3.375 g  Status:  Discontinued        3.375 g 12.5 mL/hr over 240 Minutes Intravenous Every 8 hours 08/27/20 2308 09/06/20 0956   08/27/20 1515  piperacillin-tazobactam (ZOSYN) IVPB 3.375 g        3.375 g 12.5 mL/hr over 240  Minutes Intravenous  Once 08/27/20 1507 08/28/20 1901        Assessment/Plan POD 15 s/p Exploratory laparotomy, 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 Fredricka Bonine 08/29/20 - Pathology without malignancy - CT 7/12, 7/18 w/ ileus. No IAA - WBC down to 17K - JP drain output serous and minimal 10cc/24hrs.  Will DC today - BID WTD for midline - Mobilize - Pulm toilet - reglan for ileus, 10mg  Q8hrs -colostomy working well with over 1L out yesterday - WOC has seen and HH ordered. He has performed ostomy pouch changes independently. He reports his sister will help at discharge - hypokalemia - IV repletion has been ordered and in fluids as well.  BMET in am -patient has had a chronic n/v problem for over 8 months now.  This is almost certainly compounding the difficulty we are having trying to get his NGT out and getting his stomach and SB to work. Appreciate GI evaluation and plans for EGD today.   FEN: NPO, TNA full rate, hypokalemia improved ID: zosyn 7/4>7/14 VTE: SCDs, Lovenox Foley: removed 7/7, voiding   Tobacco use - nicotine patch   HTN - stop norvasc with reinsertion of NGT, change to lopressor 5mg  QID Chronic N/V - EGD today, reglan ETOH abuse - was drinking 1/2-1 pint/day  LOS: 17 days    , [facility]  Surgery 09/13/2020, 8:46 AM Please see Amion for pager number during day hours 7:00am-4:30pm

## 2020-09-13 NOTE — Anesthesia Postprocedure Evaluation (Signed)
Anesthesia Post Note  Patient: Ryan Velasquez  Procedure(s) Performed: ESOPHAGOGASTRODUODENOSCOPY (EGD) WITH PROPOFOL (Left) BIOPSY     Patient location during evaluation: PACU Anesthesia Type: MAC Level of consciousness: awake and alert Pain management: pain level controlled Vital Signs Assessment: post-procedure vital signs reviewed and stable Respiratory status: spontaneous breathing, nonlabored ventilation, respiratory function stable and patient connected to nasal cannula oxygen Cardiovascular status: stable and blood pressure returned to baseline Postop Assessment: no apparent nausea or vomiting Anesthetic complications: no   No notable events documented.  Last Vitals:  Vitals:   09/13/20 1529 09/13/20 1935  BP: (!) 163/83 (!) 143/79  Pulse: 89 100  Resp: 16 17  Temp: 36.6 C 36.5 C  SpO2: 100% 97%    Last Pain:  Vitals:   09/13/20 1935  TempSrc: Oral  PainSc:                  Allante Whitmire

## 2020-09-13 NOTE — Transfer of Care (Signed)
Immediate Anesthesia Transfer of Care Note  Patient: Ryan Velasquez  Procedure(s) Performed: ESOPHAGOGASTRODUODENOSCOPY (EGD) WITH PROPOFOL (Left) BIOPSY  Patient Location: Endoscopy Unit  Anesthesia Type:MAC  Level of Consciousness: drowsy  Airway & Oxygen Therapy: Patient Spontanous Breathing and Patient connected to nasal cannula oxygen  Post-op Assessment: Report given to RN and Post -op Vital signs reviewed and stable  Post vital signs: Reviewed and stable  Last Vitals:  Vitals Value Taken Time  BP 141/80   Temp    Pulse 87   Resp 16   SpO2 100     Last Pain:  Vitals:   09/13/20 1310  TempSrc: Temporal  PainSc: 4       Patients Stated Pain Goal: 3 (69/79/48 0165)  Complications: No notable events documented.

## 2020-09-13 NOTE — Anesthesia Preprocedure Evaluation (Addendum)
Anesthesia Evaluation  Patient identified by MRN, date of birth, ID band Patient awake    Reviewed: Allergy & Precautions, NPO status , Patient's Chart, lab work & pertinent test results  Airway Mallampati: III  TM Distance: >3 FB Neck ROM: Full    Dental no notable dental hx. (+) Teeth Intact, Dental Advisory Given   Pulmonary neg pulmonary ROS, Current Smoker and Patient abstained from smoking.,    Pulmonary exam normal breath sounds clear to auscultation       Cardiovascular hypertension, Pt. 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 EGD for N/V  Reproductive/Obstetrics                           Anesthesia Physical Anesthesia Plan  ASA: 3  Anesthesia Plan: MAC   Post-op Pain Management:    Induction: Intravenous  PONV Risk Score and Plan: Propofol infusion and Treatment may vary due to age or medical condition  Airway Management Planned: Natural Airway  Additional Equipment:   Intra-op Plan:   Post-operative Plan:   Informed Consent: I have reviewed the patients History and Physical, chart, labs and discussed the procedure including the risks, benefits and alternatives for the proposed anesthesia with the patient or authorized representative who has indicated his/her understanding and acceptance.     Dental advisory given  Plan Discussed  with: CRNA  Anesthesia Plan Comments:         Anesthesia Quick Evaluation

## 2020-09-13 NOTE — Progress Notes (Signed)
PHARMACY - TOTAL PARENTERAL NUTRITION CONSULT NOTE  Indication: Prolonged ileus  Patient Measurements: Height: 5\' 8"  (172.7 cm) Weight: 68.9 kg (151 lb 12.8 oz) IBW/kg (Calculated) : 68.4 TPN AdjBW (KG): 74.8 Body mass index is 23.08 kg/m. Usual Weight: 93 kg  Assessment:  [name] presented on 7/4 with N/V and abdominal pain x 2 days following constipation.  Found to have colonic obstruction with mass and abscess requiring ex-lap with sigmoid colectomy and end colostomy on 7/6.  Patient has nausea and vomiting post-op and was unable to start a diet. CT on 7/12 showed post-op ileus.  Pharmacy consulted for TPN management given inadequate intake for ~12 days.  Patient reports having Covid in Dec 2021 and intake has been reduced since.  He lost ~40 lbs in 7 months.  Patient is at risk for refeeding.  Glucose / Insulin: no hx DM - CBGs < 180 on BMET.  SSI/CBG checks D/C'ed 7/16.  Electrolytes: K 3.9 post 4 runs (goal >/= 4), Mag 1.8 (goal >/= 2) others WNL Renal: SCr < 1, BUN WNL Hepatic: LFTs / tbili WNL, prealbumin 11.8>>13.5, albumin 2.4, TG down to 147 Intake / Output; MIVF: UOP 0.2 ml/kg/hr, NG 8/16 >> clamped, JP drain 41mL, colostomy 11m.  NS 40K at 75 ml/hr *previous significant NG losses (up to 9L) and required 160-240mEq K per day Pertinent GI Imaging:  7/12 CT abd - post-op ileus  7/12 Xray - gastric distention  7/16 Abx XR - mildly progressive SB ileus or partial obstruction GI Surgeries / Procedures:  7/6 ex-lap with sigmoid colectomy and end colostomy  Central access: PICC placed 09/05/20 TPN start date: 09/05/20  Nutritional Goals (per RD rec on 7/13): 2150-2350 kCal, 100-115g AA, >2L fluid per day  Current Nutrition:  TPN (s/p 7d thiamine/folate in TPN) FLD > NPO on 7/19 d/t vomiting > CLD 7/20 > NPO for EGD 7/21 Boost Plus TID - 1 charted given  Plan:  Continue TPN at goal rate 85 ml/hr to provide 105g AA, 325g CHO and 65g ILE for a total of 2182 kCal, meeting  100% of patient's needs Electrolytes in TPN: increase Na 113mEq/L, K 35mEq/L (= 68mEq/d), Ca 72mEq/L, increase Mag to 104mEq/L, Phos 70mmol/L, Cl:Ac 1:1 Add standard MVI and trace elements to TPN Mag sulfate 2gm IV x 1 NS 40K at 75 ml/hr (= 12m K per day) F/U AM labs, NG clamping trial/output  Ryan Velasquez D. 04-02-1984, PharmD, BCPS, BCCCP 09/13/2020, 8:52 AM

## 2020-09-14 LAB — BASIC METABOLIC PANEL
Anion gap: 8 (ref 5–15)
BUN: 11 mg/dL (ref 6–20)
CO2: 26 mmol/L (ref 22–32)
Calcium: 9 mg/dL (ref 8.9–10.3)
Chloride: 100 mmol/L (ref 98–111)
Creatinine, Ser: 0.56 mg/dL — ABNORMAL LOW (ref 0.61–1.24)
GFR, Estimated: >60 mL/min (ref >60)
Glucose, Bld: 132 mg/dL — ABNORMAL HIGH (ref 70–99)
Potassium: 4.6 mmol/L (ref 3.5–5.1)
Sodium: 134 mmol/L — ABNORMAL LOW (ref 135–145)

## 2020-09-14 LAB — SURGICAL PATHOLOGY

## 2020-09-14 LAB — MAGNESIUM: Magnesium: 2.2 mg/dL (ref 1.7–2.4)

## 2020-09-14 MED ORDER — SODIUM CHLORIDE 0.9 % IV SOLN: INTRAVENOUS | Status: DC

## 2020-09-14 MED ORDER — ENSURE ENLIVE PO LIQD
237.0000 mL | Freq: Three times a day (TID) | ORAL | Status: DC
  Administered 2020-09-14 – 2020-09-19 (×12): 237 mL via ORAL
  Filled 2020-09-14: qty 237

## 2020-09-14 MED ORDER — HYOSCYAMINE SULFATE 0.125 MG PO TBDP
0.2500 mg | ORAL_TABLET | Freq: Four times a day (QID) | ORAL | Status: DC | PRN
  Filled 2020-09-14: qty 2

## 2020-09-14 MED ORDER — TRAVASOL 10 % IV SOLN
INTRAVENOUS | Status: AC
  Filled 2020-09-14: qty 1050.6

## 2020-09-14 MED ORDER — HYDROMORPHONE HCL 1 MG/ML IJ SOLN
0.5000 mg | INTRAMUSCULAR | Status: DC | PRN
  Administered 2020-09-14 – 2020-09-15 (×3): 0.5 mg via INTRAVENOUS
  Filled 2020-09-14 (×3): qty 0.5

## 2020-09-14 MED ORDER — TRAVASOL 10 % IV SOLN: INTRAVENOUS | Status: DC

## 2020-09-14 NOTE — Progress Notes (Signed)
PHARMACY - TOTAL PARENTERAL NUTRITION CONSULT NOTE  Indication: Prolonged ileus  Patient Measurements: Height: 5\' 8"  (172.7 cm) Weight: 68.9 kg (151 lb 12.8 oz) IBW/kg (Calculated) : 68.4 TPN AdjBW (KG): 74.8 Body mass index is 23.08 kg/m. Usual Weight: 93 kg  Assessment:  [name] presented on 7/4 with N/V and abdominal pain x 2 days following constipation.  Found to have colonic obstruction with mass and abscess requiring ex-lap with sigmoid colectomy and end colostomy on 7/6.  Patient has nausea and vomiting post-op and was unable to start a diet. CT on 7/12 showed post-op ileus.  Pharmacy consulted for TPN management given inadequate intake for ~12 days.  Patient reports having Covid in Dec 2021 and intake has been reduced since.  He lost ~40 lbs in 7 months.  Patient is at risk for refeeding.  Glucose / Insulin: no hx DM - CBGs < 180 on BMET.  SSI/CBG checks D/C'ed 7/16.  Electrolytes: Na 134, Mag 2.2 post 2gm, others WNL (K uptrending, CoCa high normal at 10.44) Renal: SCr < 1, BUN WNL Hepatic: LFTs / tbili WNL, prealbumin 11.8>>13.5, albumin 2.2, TG down to 147 Intake / Output; MIVF: UOP not documented, vomited x2 on 7/22 so NG unclamped with 8/22 output > now clamped again per RN, JP drain 37mL, colostomy 1m.  NS 40K at 75 ml/hr  *previous significant NG losses (up to 9L) and required 160-284mEq K per day *when NGT clamped, required ~80-172mEq K per day Pertinent GI Imaging:  7/12 CT abd - post-op ileus  7/12 Xray - gastric distention  7/16 Abx XR - mildly progressive SB ileus or partial obstruction GI Surgeries / Procedures:  7/6 ex-lap with sigmoid colectomy and end colostomy 7/21 EGD - many non-bleeding erosions, hiatal hernia, retained gastric fluid, distention deformity of entire stomach, biopsied  Central access: PICC placed 09/05/20 TPN start date: 09/05/20  Nutritional Goals (per RD rec on 7/13): 2150-2350 kCal, 100-115g AA, >2L fluid per day  Current Nutrition:   TPN (s/p 7d thiamine/folate in TPN for hx EtOH) FLD > NPO on 7/19 d/t vomiting > CLD 7/20 > NPO for EGD 7/21 and remained NPO Boost Plus TID - none charted given  Plan:  Continue TPN at goal rate 85 ml/hr to provide 105g AA, 325g CHO and 65g ILE for a total of 2182 kCal, meeting 100% of patient's needs Electrolytes in TPN: increase Na 145mEq/L, K 85mEq/L (= 18mEq/d), reduce Ca to 59mEq/L, increase Mag to 46mEq/L on 7/21, Phos 90mmol/L, Cl:Ac 1:1 Add standard MVI and trace elements to TPN Remove KCL from NS (was getting 12m K per day for past ~3 days) Standard TPN labs on Mon and Thurs - consider repeating BMET/Mag on Sunday Monitor NG output/claming trial and the need to add back KCL in IVF  Heavenly Christine D. 04-02-1984, PharmD, BCPS, BCCCP 09/14/2020, 9:59 AM

## 2020-09-14 NOTE — Progress Notes (Signed)
Progress Note  1 Day Post-Op  Subjective: Vomited after e-mycin dose last night.  Unclear if from the med or from gastroparesis.  Colostomy putting out about 2.5L yesterday.  EGD results noted.   Objective: Vital signs in last 24 hours: Temp:  [97.2 F (36.2 C)-98.2 F (36.8 C)] 98 F (36.7 C) (07/22 1000) Pulse Rate:  [85-106] 98 (07/22 1000) Resp:  [14-18] 18 (07/22 1000) BP: (141-168)/(74-83) 152/75 (07/22 1000) SpO2:  [97 %-100 %] 98 % (07/22 1000) Last BM Date: 09/14/20  Intake/Output from previous day: 07/21 0701 - 07/22 0700 In: 2565.2 [I.V.:2165.1; IV Piggyback:400.1] Out: 3051 [Emesis/NG output:700; Stool:2350; Blood:1] Intake/Output this shift: No intake/output data recorded.  PE: Abd: still soft, NGT clamped, minimal tenderness.  Midline stable, JP out.  Colostomy with viable stoma and 2.5L of output yesterday.   Lab Results:  Recent Labs    09/12/20 0433 09/13/20 0439  WBC 19.7* 17.9*  HGB 10.1* 9.8*  HCT 30.6* 29.8*  PLT 612* 559*   BMET Recent Labs    09/13/20 0439 09/14/20 0313  NA 135 134*  K 3.9 4.6  CL 103 100  CO2 25 26  GLUCOSE 103* 132*  BUN 7 11  CREATININE 0.51* 0.56*  CALCIUM 8.6* 9.0   PT/INR No results for input(s): LABPROT, INR in the last 72 hours. CMP     Component Value Date/Time   NA 134 (L) 09/14/2020 0313   K 4.6 09/14/2020 0313   CL 100 09/14/2020 0313   CO2 26 09/14/2020 0313   GLUCOSE 132 (H) 09/14/2020 0313   BUN 11 09/14/2020 0313   CREATININE 0.56 (L) 09/14/2020 0313   CALCIUM 9.0 09/14/2020 0313   PROT 5.8 (L) 09/13/2020 0439   ALBUMIN 2.2 (L) 09/13/2020 0439   AST 19 09/13/2020 0439   ALT 29 09/13/2020 0439   ALKPHOS 91 09/13/2020 0439   BILITOT 0.5 09/13/2020 0439   GFRNONAA >60 09/14/2020 0313   Lipase     Component Value Date/Time   LIPASE 25 08/27/2020 1125       Studies/Results: No results found.  Anti-infectives: Anti-infectives (From admission, onward)    Start     Dose/Rate  Route Frequency Ordered Stop   09/13/20 1800  erythromycin (E-MYCIN) tablet 250 mg        250 mg Oral 3 times daily with meals & bedtime 09/13/20 1547     08/28/20 2315  piperacillin-tazobactam (ZOSYN) IVPB 3.375 g  Status:  Discontinued        3.375 g 12.5 mL/hr over 240 Minutes Intravenous Every 8 hours 08/27/20 2308 09/06/20 0956   08/27/20 1515  piperacillin-tazobactam (ZOSYN) IVPB 3.375 g        3.375 g 12.5 mL/hr over 240 Minutes Intravenous  Once 08/27/20 1507 08/28/20 1901        Assessment/Plan POD 16 s/p Exploratory laparotomy, 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 Fredricka Bonine 08/29/20 - Pathology without malignancy - CT 7/12, 7/18 w/ ileus. No IAA - WBC down to 17K yesterday - JP drain removed on 7/21 - BID WTD for midline - Mobilize - Pulm toilet - will try FLD with NGT clamped and see how he does - reglan for ileus, 10mg  Q6hrs, add e-mycin 250 TID for motility as well -colostomy working well with over 2L out yesterday - WOC has seen and HH ordered. He has performed ostomy pouch changes independently. He reports his sister will help at discharge  -patient likely  has significant gastroparesis explaining his chronic N/V.  He is on reglan 10mg  q 6 and e-mycin.  Will have dietitian see for post gastrectomy type diet so he can eat 5-6 small meals a day and take it slow to see how he tolerates this.   FEN: FLD, TNA full rate ID: [email or web address] 7/4>7/14 VTE: SCDs, Lovenox Foley: removed 7/7, voiding   Tobacco use - nicotine patch   HTN - stop norvasc with reinsertion of NGT, change to lopressor 5mg  QID Chronic N/V, likely gastroparesis ETOH abuse - was drinking 1/2-1 pint/day  LOS: 18 days    , [facility] 09/14/2020, 11:32 AM Please see Amion for pager number during day hours 7:00am-4:30pm

## 2020-09-14 NOTE — Progress Notes (Signed)
Nutrition Follow-up  DOCUMENTATION CODES:   Severe malnutrition in context of chronic illness  INTERVENTION:   -TPN management per pharmacy -D/c Boost Plus -Ensure Enlive po TID, each supplement provides 350 kcal and 20 grams of protein  -Magic cup TID with meals, each supplement provides 290 kcal and 9 grams of protein  -RD provided education on gastroparesis diet and provided "Gastroparesis Nutrition Therapy" handout from [facility] Nutrition Care Manual  NUTRITION DIAGNOSIS:   Severe Malnutrition related to chronic illness as evidenced by severe muscle depletion, energy intake < or equal to 75% for > or equal to 1 month, percent weight loss.  Ongoing  GOAL:   Patient will meet greater than or equal to 90% of their needs  Progressing   MONITOR:   PO intake, Supplement acceptance, Diet advancement, Labs, Weight trends, Skin, I & O's  REASON FOR ASSESSMENT:   Consult New TPN/TNA  ASSESSMENT:   50 yo male with a PMH of EtOH abuse and HTN who presents with colonic obstruction.  7/11 - emesis (green) x1 7/12 - CT showed ileus 7/17 - advanced to clear liquids 7/18 - advanced to full liquids then made NPO; emesis (800 ml) 7/20 - advanced back to full liquids 7/21- s/p EGD- revealed multiple non-bleeding erosions in the middle/distal esophagus (biopsied), 3 cm hiatal hernia, 1000 ml retained gastric fluid (removed), erythematous mucosa in stomach (biopsied)  Reviewed I/O's: -486 ml x 24 hours and -16.1 L since 08/31/20  NGT: 700 ml x 24 hours  Colostomy output: 2.4 L x 24 hours   Per MD notes, plan to advance diet and wean TPN if pt tolerates.   Case discussed with RN, who reports that pt was just advanced back to full liquids today. His NGT is currently clamped. She reports pt is tolerating oral intake.   Spoke with pt and sister at bedside. Sister is anxious about gastroparesis diagnosis and had multiple questions about diet and pt progression. Per sister, pt ate yogurt  and tolerated well this afternoon. He does not like the Boost supplements.   RD reviewed current nutrition care plan. Discussed plan to advance to soft diet is tolerating full liquids, as well as potentially weaning TPN. Discussed importance of good meal and supplement intake to promote healing. RD spent time with pt sister discussing gastroparesis diet guidelines (small, frequent meals; limiting high fat and high fiber foods; and adding nutritional supplements for increased calorie and protein needs). Expect fair compliance.   Pt with increased ostomy output per his report, but output appearance at baseline.   Per pharmacy note, plan to continue TPN at goal rate of 85 ml/hr, which provides 2182 kcals and 105 grams protein, meeting 100% of estimated kcal and protein needs.   Labs reviewed: Na: 134.    Diet Order:   Diet Order             Diet full liquid Room service appropriate? Yes; Fluid consistency: Thin  Diet effective now                   EDUCATION NEEDS:   Education needs have been addressed  Skin:  Skin Assessment: Skin Integrity Issues: Skin Integrity Issues:: Incisions Incisions: Abdomen, closed  Last BM:  09/12/20 - colostomy (225 ml so far today); 7/19 - 2675 ml  Height:   Ht Readings from Last 1 Encounters:  08/27/20 5\' 8"  (1.727 m)    Weight:   Wt Readings from Last 1 Encounters:  09/12/20 68.9 kg  BMI:  Body mass index is 23.08 kg/m.  Estimated Nutritional Needs:   Kcal:  2150-2350  Protein:  100-115 grams  Fluid:  >2 L    Levada Schilling, RD, LDN, CDCES Registered Dietitian II Certified Diabetes Care and Education Specialist Please refer to [facility] for RD and/or RD on-call/weekend/after hours pager

## 2020-09-14 NOTE — Progress Notes (Addendum)
0100: Pt vomitted x2. C/o of abd pain 10/10. Pt placed back to low intermittent wall suction.  PRN pain meds given. Pt resting comfortably now with 600 cc of NGT output.Will continue to monitor pt.

## 2020-09-14 NOTE — Discharge Instructions (Addendum)
Gastroparesis Nutrition Therapy  Gastroparesis means that your stomach empties very slowly. This happens when the nerves to your stomach are damaged or do not work properly. This can cause bloating, stomach discomfort or pain, feeling full after eating only a small amount of food, nausea, or vomiting. If you have diabetes in addition to gastroparesis, it is important to control your blood glucose. This will help the stomach empty.  Tips Following these tips may help your stomach empty faster:  Eat small, frequent meals (4 to 6 times per day).  Do not eat solid foods that are high in fat and do not add too much fat to foods. See the Foods Not Recommended table for foods that are high fat.  High-fat solid foods may delay the emptying of your stomach.  Liquids that contain fat, such as milkshakes, may be tolerated and can provide needed calories. Do not eat foods high in fiber. Do not take fiber supplements or fiber bulking agents for constipation.  Do not eat foods that increase acid reflux:  Acidic, spicy, fried and greasy foods  Caffeine  Mint Do not drink alcohol or smoke  Do not drink carbonated beverages, as they increase bloating.  Chew foods well before swallowing. Solid foods in the stomach do not empty well. If you have difficulty tolerating solid foods, ground foods may be better.  If symptoms are severe, semi-solid foods or liquids may need to be your main food sources. Choose liquid nutritional supplements that have less than or equal to 2 grams fiber per serving.  Sit upright while eating and sit upright or walk after meals. Do not lie down for 3 to 4 hours after eating to avoid reflux or regurgitation.  If you wish to nap during the day, nap first and then eat.  Drinking fluids at meals can take up room in your stomach, and you might not get enough calories. At every meal, first eat a grain food and a protein food or dairy product if your body can tolerate it. Drink fluids with  calories. It may be better to delay fluids until after the meal and drink more between meals.   Foods Recommended Food Group Foods Recommended   Grains Choose grain foods with less than 2 grams of fiber per serving; these will be made with white flour Crackers: saltines or graham crackers Cold cereal: puffed rice Cream of rice or wheat Grits (fine ground) Gluten free low fiber foods Pretzels White bread, toasted White rice, cook until very soft  Protein Foods Lean meat and poultry: well-cooked, very tender, moist, and chopped fine Fish: tuna, salmon, or white fish Egg whites, scrambled Peanut butter (limit to 1 tablespoon at a time)  Dairy Milk*, drink 2% if tolerated to get more nutrients or lactose-free 2% milk Fortified non-dairy milks: almond, cashew, coconut, or rice (be aware that these options are not good sources of protein so you will need to eat an additional protein food) Fortified pea milk or soymilk (may cause gas and bloating for some) Instant breakfast* (pre-made lactose-free is sold in bottles) Milkshakes* (try blending in  to  cup canned fruit) Ice cream* (low-fat may be tolerated better; use in milkshakes to increase calories) Frozen yogurt Yogurt* Puddings and custard* Sherbet Liquid nutritional supplements with less than or equal to 2 grams fiber per 1 cup serving *Use lactose-free varieties to reduce gas and bloating  Vegetables Canned and well-cooked vegetables without seeds, skins or hulls Carrots, cooked Mashed potatoes (white, red or yellow) Sweet   potato  Fruit Canned, soft and well-cooked fruits without seeds, skins or membranes Applesauce Banana, mashed may be tolerated better Diced peaches/pears fruit cups in juice Melon, very soft, cut into small pieces Fruit nectar juices  Oils When possible choose oils rather than solid fats Canola or olive oil Margarine  Other Clear soup Gelatin Popsicles   Foods Not Recommended Food Group Foods Not  Recommended   Grains Bran Grains foods with 2 or more grams of fiber per serving: barley, brown rice, kasha, quinoa Popcorn Whole grain and high-fiber cereals, including oats or granola Whole grain bread or pasta  Protein Foods Fried meats, poultry or fish Sausage, bacon or hot dogs Seafood Tough meat, meat with gristle: steak, roast beef or pork chops Beans, peas or lentils Nuts  Dairy Cheese slices Liquid nutritional supplements that have more than 2 grams fiber per serving Pea milk, soymilk (may increase gas and bloating)  Vegetables Raw or undercooked vegetables Alfalfa, asparagus, bean sprouts, broccoli, brussels sprouts, cabbage, cauliflower, corn, green peas or any other kind of peas, lima beans, mushrooms, okra, onions, parsnips, peppers, pickles, potato skins, or spinach  Fruit Fresh fruit except for the ones in the foods recommended table Acidic fruit and juices: oranges/orange juice, grapefruit/grapefruit juice, tomatoes/tomato juice Avocado Berries Coconut Dried fruit Fruit skin Mandarin oranges Pineapple  Oils Fried foods of any type  Other Coffee Olives or pickles Pizza Salsa Sushi   Gastroparesis Sample 1-Day Menu  Breakfast 1 slice white toast (1 carbohydrate serving)  1 teaspoon margarine, soft, tub   cup egg substitute  1 cup peach nectar (2 carbohydrate servings)  Morning Snack Smoothie made with:  small banana (1 carbohydrate serving)  1/3 cup Greek strawberry yogurt ( carbohydrate serving)  1 cup 2% milk (1 carbohydrate serving)  Lunch 2 ounces canned chicken  1 teaspoon mayonnaise  9 saltine crackers (1 carbohydrate servings)   cup applesauce (1 carbohydrate serving)  Afternoon Snack 1 slice white toast (1 carbohydrate serving)  1 tablespoon smooth peanut butter  Evening Meal 2 ounces baked fish   cup mashed potatoes (1 carbohydrate serving)  1 teaspoon olive oil  1 cup 2% milk (1 carbohydrate serving)  Evening Snack 1 packet instant  breakfast (1 carbohydrate servings)  1 cup 2% milk (1 carbohydrate serving)   Gastroparesis Vegetarian (Lacto-Ovo) Sample 1-Day Menu  Breakfast  cup cooked farina (1 carbohydrate serving)   cup egg substitute  2 teaspoons olive oil   cup peach nectar (2 carbohydrate servings)   cup 2% milk ( carbohydrate serving)  Morning Snack 1 slice white toast (1 carbohydrate serving)  1 tablespoon smooth peanut butter  Lunch  cup vegetable soup (1 carbohydrate serving)  9 saltine crackers (1 carbohydrate serving)   cup applesauce (1 carbohydrate serving)   cup 2% milk ( carbohydrate serving)  Afternoon Snack 6 ounces plain yogurt (1 carbohydrate serving)   small banana (1 carbohydrate servings)  Evening Meal  cup baked tofu  2/3 cup white rice (2 carbohydrate servings)  2 teaspoons olive oil   cup 2% milk ( carbohydrate serving)  Evening Snack 1 packet instant breakfast (1 carbohydrate servings)  1 cup 2% milk (1 carbohydrate serving)   Gastroparesis Vegan Sample 1-Day Menu  Breakfast  cup cooked farina (1 carbohydrate serving)  1/3 cup tofu scramble  2 teaspoons olive oil   cup peach nectar (2 carbohydrate servings)   cup almond milk fortified with calcium, vitamin B12, and vitamin D  Morning Snack 1 slice white toast (1   carbohydrate serving)  1 tablespoon smooth peanut butter  Lunch  cup vegetable soup (1 carbohydrate serving)  9 saltine crackers (1 carbohydrate serving)   cup applesauce (1 carbohydrate serving)  Afternoon Snack 6 ounces plain soy yogurt (1 carbohydrate servings)   small banana (1 carbohydrate serving)  Evening Meal  cup baked tofu  2/3 cup white rice (2 carbohydrate servings)  2 teaspoons olive oil   cup almond milk fortified with calcium, vitamin B12, and vitamin D  Evening Snack  scoop soy protein powder ( carbohydrate serving)   cup almond milk fortified with calcium, vitamin B12, and vitamin D  Copyright 2020  [employer]. All rights reserved.   MIDLINE WOUND CARE: - midline dressing to be changed twice daily - supplies: sterile saline, gauze, scissors, ABD pads, tape  - remove dressing and all packing carefully, moistening with sterile saline as needed to avoid packing/internal dressing sticking to the wound. - clean edges of skin around the wound with water/gauze, making sure there is no tape debris or leakage left on skin that could cause skin irritation or breakdown. - dampen and clean kerlix with sterile saline and pack wound from wound base to skin level, making sure to take note of any possible areas of wound tracking, tunneling and packing appropriately. Wound can be packed loosely. Trim kerlix to size if a whole kerlix is not required. - cover wound with a dry ABD pad and secure with tape.  - write the date/time on the dry dressing/tape to better track when the last dressing change occurred. - apply any skin protectant/powder recommended by clinician to protect skin/skin folds. - change dressing as needed if leakage occurs, wound gets contaminated, or patient requests to shower. - patient may shower daily with wound open and following the shower the wound should be dried and a clean dressing placed.   [facility]      [facility], [place] [phone]  OPEN ABDOMINAL SURGERY: POST OP INSTRUCTIONS  Always review your discharge instruction sheet given to you by the facility where your surgery was performed.  IF YOU HAVE DISABILITY OR FAMILY LEAVE FORMS, YOU MUST BRING THEM TO THE OFFICE FOR PROCESSING.  PLEASE DO NOT GIVE THEM TO YOUR DOCTOR.  A prescription for pain medication may be given to you upon discharge.  Take your pain medication as prescribed, if needed.  If narcotic pain medicine is not needed, then you may take acetaminophen (Tylenol) or ibuprofen (Advil) as needed. Take your usually prescribed medications unless otherwise directed. If you need a refill on your pain medication,  please contact your pharmacy. They will contact our office to request authorization.  Prescriptions will not be filled after 5pm or on week-ends. You should follow a light diet the first few days after arrival home, such as soup and crackers, pudding, etc.unless your doctor has advised otherwise. A high-fiber, low fat diet can be resumed as tolerated.   Be sure to include lots of fluids daily. Most patients will experience some swelling and bruising on the chest and neck area.  Ice packs will help.  Swelling and bruising can take several days to resolve Most patients will experience some swelling and bruising in the area of the incision. Ice pack will help. Swelling and bruising can take several days to resolve..  It is common to experience some constipation if taking pain medication after surgery.  Increasing fluid intake and taking a stool softener will usually help or prevent this problem from  occurring.  A mild laxative (Milk of Magnesia or Miralax) should be taken according to package directions if there are no bowel movements after 48 hours.  You may have steri-strips (small skin tapes) in place directly over the incision.  These strips should be left on the skin for 7-10 days.  If your surgeon used skin glue on the incision, you may shower in 24 hours.  The glue will flake off over the next 2-3 weeks.  Any sutures or staples will be removed at the office during your follow-up visit. You may find that a light gauze bandage over your incision may keep your staples from being rubbed or pulled. You may shower and replace the bandage daily. ACTIVITIES:  You may resume regular (light) daily activities beginning the next day--such as daily self-care, walking, climbing stairs--gradually increasing activities as tolerated.  You may have sexual intercourse when it is comfortable.  Refrain from any heavy lifting or straining until approved by your doctor. You may drive when you no longer are taking prescription  pain medication, you can comfortably wear a seatbelt, and you can safely maneuver your car and apply brakes Return to Work: ___________________________________ Bonita Quin should see your doctor in the office for a follow-up appointment approximately two weeks after your surgery.  Make sure that you call for this appointment within a day or two after you arrive home to insure a convenient appointment time. OTHER INSTRUCTIONS:  _____________________________________________________________ _____________________________________________________________  WHEN TO CALL YOUR DOCTOR: Fever over 101.0 Inability to urinate Nausea and/or vomiting Extreme swelling or bruising Continued bleeding from incision. Increased pain, redness, or drainage from the incision. Difficulty swallowing or breathing Muscle cramping or spasms. Numbness or tingling in hands or feet or around lips.  The clinic staff is available to answer your questions during regular business hours.  Please don't hesitate to call and ask to speak to one of the nurses if you have concerns.  For further questions, please visit [email or web address]

## 2020-09-15 ENCOUNTER — Encounter: Payer: Self-pay | Admitting: Gastroenterology

## 2020-09-15 LAB — CBC
HCT: 30.1 % — ABNORMAL LOW (ref 39.0–52.0)
Hemoglobin: 9.8 g/dL — ABNORMAL LOW (ref 13.0–17.0)
MCH: 34.3 pg — ABNORMAL HIGH (ref 26.0–34.0)
MCHC: 32.6 g/dL (ref 30.0–36.0)
MCV: 105.2 fL — ABNORMAL HIGH (ref 80.0–100.0)
Platelets: 478 10*3/uL — ABNORMAL HIGH (ref 150–400)
RBC: 2.86 MIL/uL — ABNORMAL LOW (ref 4.22–5.81)
RDW: 14.8 % (ref 11.5–15.5)
WBC: 15.4 10*3/uL — ABNORMAL HIGH (ref 4.0–10.5)
nRBC: 0 % (ref 0.0–0.2)

## 2020-09-15 LAB — BASIC METABOLIC PANEL
Anion gap: 6 (ref 5–15)
BUN: 8 mg/dL (ref 6–20)
CO2: 28 mmol/L (ref 22–32)
Calcium: 8.6 mg/dL — ABNORMAL LOW (ref 8.9–10.3)
Chloride: 98 mmol/L (ref 98–111)
Creatinine, Ser: 0.5 mg/dL — ABNORMAL LOW (ref 0.61–1.24)
GFR, Estimated: >60 mL/min (ref >60)
Glucose, Bld: 97 mg/dL (ref 70–99)
Potassium: 3.5 mmol/L (ref 3.5–5.1)
Sodium: 132 mmol/L — ABNORMAL LOW (ref 135–145)

## 2020-09-15 MED ORDER — OXYCODONE HCL 5 MG PO TABS: 5.0000 mg | ORAL_TABLET | Freq: Four times a day (QID) | ORAL | Status: DC | PRN

## 2020-09-15 MED ORDER — METHOCARBAMOL 1000 MG/10ML IJ SOLN
1000.0000 mg | Freq: Four times a day (QID) | INTRAVENOUS | Status: DC | PRN
  Administered 2020-09-15 – 2020-09-16 (×3): 1000 mg via INTRAVENOUS
  Filled 2020-09-15: qty 10
  Filled 2020-09-15: qty 1000

## 2020-09-15 MED ORDER — TRAVASOL 10 % IV SOLN
INTRAVENOUS | Status: AC
  Filled 2020-09-15: qty 1050.6

## 2020-09-15 MED ORDER — HYDROMORPHONE HCL 1 MG/ML IJ SOLN
0.5000 mg | Freq: Four times a day (QID) | INTRAMUSCULAR | Status: DC | PRN
  Administered 2020-09-15 – 2020-09-18 (×11): 0.5 mg via INTRAVENOUS
  Filled 2020-09-15 (×11): qty 0.5

## 2020-09-15 MED ORDER — POTASSIUM CHLORIDE 10 MEQ/50ML IV SOLN
10.0000 meq | INTRAVENOUS | Status: AC
Start: 2020-09-15 — End: 2020-09-15
  Administered 2020-09-15 (×5): 10 meq via INTRAVENOUS
  Filled 2020-09-15 (×5): qty 50

## 2020-09-15 MED ORDER — OXYCODONE HCL 5 MG PO TABS
5.0000 mg | ORAL_TABLET | Freq: Four times a day (QID) | ORAL | Status: DC | PRN
  Administered 2020-09-15: 5 mg via ORAL
  Administered 2020-09-15 – 2020-09-19 (×13): 10 mg via ORAL
  Filled 2020-09-15 (×14): qty 2

## 2020-09-15 NOTE — Progress Notes (Signed)
PHARMACY - TOTAL PARENTERAL NUTRITION CONSULT NOTE  Indication: Prolonged ileus  Patient Measurements: Height: 5\' 8"  (172.7 cm) Weight: 68.9 kg (151 lb 12.8 oz) IBW/kg (Calculated) : 68.4 TPN AdjBW (KG): 74.8 Body mass index is 23.08 kg/m. Usual Weight: 93 kg  Assessment:  [name] presented on 7/4 with N/V and abdominal pain x 2 days following constipation.  Found to have colonic obstruction with mass and abscess requiring ex-lap with sigmoid colectomy and end colostomy on 7/6.  Patient has nausea and vomiting post-op and was unable to start a diet. CT on 7/12 showed post-op ileus.  Pharmacy consulted for TPN management given inadequate intake for ~12 days.  Patient reports having Covid in Dec 2021 and intake has been reduced since.  He lost ~40 lbs in 7 months.  Patient is at risk for refeeding.  Glucose / Insulin: no hx DM - CBGs < 180 on BMET. Down to 74. SSI/CBG checks D/C'ed 7/16.  Electrolytes: Na 132 (on max Na in TPN 7/22), K 3.5, CoCa 10.04. Other electrolytes wnl.  Renal: SCr < 1, BUN WNL Hepatic: LFTs / tbili WNL, prealbumin 11.8>>13.5, albumin 2.2, TG down to 147 Intake / Output; MIVF: UOP not documented, 3 occurences / 24 hrs. NG output 0 ml. Colostomy 2725 ml.  - NS @75  ml/hr - vomited x2 on 7/22 so NG unclamped with output > now clamped again per RN *previous significant NG losses (up to 9L) and required 160-222mEq K per day *when NGT clamped, required ~80-120mEq K per day Pertinent GI Imaging:  7/12 CT abd - post-op ileus  7/12 Xray - gastric distention  7/16 Abx XR - mildly progressive SB ileus or partial obstruction GI Surgeries / Procedures:  7/6 ex-lap with sigmoid colectomy and end colostomy 7/21 EGD - many non-bleeding erosions, hiatal hernia, retained gastric fluid, distention deformity of entire stomach, biopsied  Central access: PICC placed 09/05/20 TPN start date: 09/05/20  Nutritional Goals (per RD rec on 7/23): 2150-2350 kCal, 100-115g AA, >2L  fluid per day  Current Nutrition:  TPN (s/p 7d thiamine/folate in TPN for hx EtOH) FLD > NPO on 7/19 d/t vomiting > CLD 7/20 > NPO for EGD 7/21 and remained NPO > FLD - Pt ate 50% of chicken brooth, ice cream, and ensure yesterday Boost Plus TID - none charted given  Plan:  Continue TPN at goal rate 85 ml/hr to provide 105g AA, 325g CHO and 65g ILE for a total of 2182 kCal, meeting 100% of patient's needs Electrolytes in TPN: Increase K to 45 mEq/L (=92 mEq/day). Continue Na 173mEq/L, Ca to 85mEq/L, Mag to 63mEq/L on 7/21, Phos 63mmol/L, Cl:Ac 1:1 Kcl 10 mEq x5 per MD Add standard MVI and trace elements to TPN Continue NS per MD Standard TPN labs on Mon and Thurs, repeat electrolytes tomorrow Monitor NG output/claming trial and the need to add back KCL in IVF  Discussed with Dr. 12m and continue full TPN tomorrow with possible wean tomorrow depending on oral intake   Thu, PharmD, BCPS Clinical Pharmacist 09/15/2020 7:51 AM

## 2020-09-15 NOTE — Progress Notes (Signed)
Progress Note  2 Days Post-Op  Subjective: Feeling well. No n/v last 24h. Tolerating FLD with NG clamped.   Objective: Vital signs in last 24 hours: Temp:  [97.5 F (36.4 C)-98.8 F (37.1 C)] 97.6 F (36.4 C) (07/23 0445) Pulse Rate:  [91-99] 91 (07/23 0445) Resp:  [18] 18 (07/23 0445) BP: (142-152)/(73-86) 143/73 (07/23 0445) SpO2:  [98 %-100 %] 100 % (07/23 0445) Last BM Date: 09/14/20  Intake/Output from previous day: 07/22 0701 - 07/23 0700 In: 4326.8 [P.O.:600; I.V.:3326.7; IV Piggyback:400.2] Out: 2725 [Stool:2725] Intake/Output this shift: No intake/output data recorded.  PE: Abd: still soft, NGT clamped, minimal tenderness.  Midline wound granulating well.  Colostomy with viable stoma and 2.7L of output yesterday.   Lab Results:  Recent Labs    09/13/20 0439 09/15/20 0449  WBC 17.9* 15.4*  HGB 9.8* 9.8*  HCT 29.8* 30.1*  PLT 559* 478*    BMET Recent Labs    09/14/20 0313 09/15/20 0449  NA 134* 132*  K 4.6 3.5  CL 100 98  CO2 26 28  GLUCOSE 132* 97  BUN 11 8  CREATININE 0.56* 0.50*  CALCIUM 9.0 8.6*    PT/INR No results for input(s): LABPROT, INR in the last 72 hours. CMP     Component Value Date/Time   NA 132 (L) 09/15/2020 0449   K 3.5 09/15/2020 0449   CL 98 09/15/2020 0449   CO2 28 09/15/2020 0449   GLUCOSE 97 09/15/2020 0449   BUN 8 09/15/2020 0449   CREATININE 0.50 (L) 09/15/2020 0449   CALCIUM 8.6 (L) 09/15/2020 0449   PROT 5.8 (L) 09/13/2020 0439   ALBUMIN 2.2 (L) 09/13/2020 0439   AST 19 09/13/2020 0439   ALT 29 09/13/2020 0439   ALKPHOS 91 09/13/2020 0439   BILITOT 0.5 09/13/2020 0439   GFRNONAA >60 09/15/2020 0449   Lipase     Component Value Date/Time   LIPASE 25 08/27/2020 1125       Studies/Results: No results found.  Anti-infectives: Anti-infectives (From admission, onward)    Start     Dose/Rate Route Frequency Ordered Stop   09/13/20 1800  erythromycin (E-MYCIN) tablet 250 mg        250 mg Oral 3  times daily with meals & bedtime 09/13/20 1547     08/28/20 2315  piperacillin-tazobactam (ZOSYN) IVPB 3.375 g  Status:  Discontinued        3.375 g 12.5 mL/hr over 240 Minutes Intravenous Every 8 hours 08/27/20 2308 09/06/20 0956   08/27/20 1515  piperacillin-tazobactam (ZOSYN) IVPB 3.375 g        3.375 g 12.5 mL/hr over 240 Minutes Intravenous  Once 08/27/20 1507 08/28/20 1901        Assessment/Plan POD 15 s/p Exploratory laparotomy, 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 Fredricka Bonine 08/29/20 - Pathology without malignancy - CT 7/12, 7/18 w/ ileus. No IAA - WBC downtrending - JP drain removed on 7/21 - BID WTD for midline - Mobilize - Pulm toilet - DC NG, continue FLD/ small frequent meals - reglan for ileus, 10mg  Q6hrs, add e-mycin 250 TID for motility as well -colostomy working well with over 2L out yesterday -Hypokalemia- replace IV - WOC has seen and HH ordered. He has performed ostomy pouch changes independently. He reports his sister will help at discharge  -patient likely has significant gastroparesis explaining his chronic N/V.  He is on reglan 10mg  q 6 and e-mycin.  Dietician  has seen for post gastrectomy type diet so he can eat 5-6 small meals a day and take it slow to see how he tolerates this.   FEN: FLD, TNA full rate ID: [email or web address] 7/4>7/14 VTE: SCDs, Lovenox Foley: removed 7/7, voiding   Tobacco use - nicotine patch   HTN - norvasc Chronic N/V, likely gastroparesis ETOH abuse - was drinking 1/2-1 pint/day  LOS: 19 days    Berna Bue, MD [facility] 09/15/2020, 8:05 AM Please see Amion for pager number during day hours 7:00am-4:30pm

## 2020-09-16 LAB — BASIC METABOLIC PANEL
Anion gap: 9 (ref 5–15)
BUN: 7 mg/dL (ref 6–20)
CO2: 29 mmol/L (ref 22–32)
Calcium: 8.8 mg/dL — ABNORMAL LOW (ref 8.9–10.3)
Chloride: 96 mmol/L — ABNORMAL LOW (ref 98–111)
Creatinine, Ser: 0.49 mg/dL — ABNORMAL LOW (ref 0.61–1.24)
GFR, Estimated: >60 mL/min (ref >60)
Glucose, Bld: 110 mg/dL — ABNORMAL HIGH (ref 70–99)
Potassium: 3.9 mmol/L (ref 3.5–5.1)
Sodium: 134 mmol/L — ABNORMAL LOW (ref 135–145)

## 2020-09-16 LAB — MAGNESIUM: Magnesium: 1.9 mg/dL (ref 1.7–2.4)

## 2020-09-16 LAB — PHOSPHORUS: Phosphorus: 3.7 mg/dL (ref 2.5–4.6)

## 2020-09-16 MED ORDER — CALCIUM POLYCARBOPHIL 625 MG PO TABS
625.0000 mg | ORAL_TABLET | Freq: Every day | ORAL | Status: DC
  Administered 2020-09-17: 625 mg via ORAL
  Filled 2020-09-16 (×2): qty 1

## 2020-09-16 MED ORDER — TRAVASOL 10 % IV SOLN
INTRAVENOUS | Status: AC
  Filled 2020-09-16: qty 556.2

## 2020-09-16 NOTE — Progress Notes (Signed)
PHARMACY - TOTAL PARENTERAL NUTRITION CONSULT NOTE  Indication: Prolonged ileus  Patient Measurements: Height: 5\' 8"  (172.7 cm) Weight: 68.9 kg (151 lb 12.8 oz) IBW/kg (Calculated) : 68.4 TPN AdjBW (KG): 74.8 Body mass index is 23.08 kg/m. Usual Weight: 93 kg  Assessment:  [name] presented on 7/4 with N/V and abdominal pain x 2 days following constipation.  Found to have colonic obstruction with mass and abscess requiring ex-lap with sigmoid colectomy and end colostomy on 7/6.  Patient has nausea and vomiting post-op and was unable to start a diet. CT on 7/12 showed post-op ileus.  Pharmacy consulted for TPN management given inadequate intake for ~12 days.  Patient reports having Covid in Dec 2021 and intake has been reduced since.  He lost ~40 lbs in 7 months.  Patient is at risk for refeeding.  Glucose / Insulin: no hx DM - CBGs < 180 on BMET.  - SSI/CBG checks D/C'ed 7/16.  Electrolytes: Na 134 (on max Na in TPN 7/22), K 3.9 (s/p Kcl 10 mEq x 5). Cl 96, CoCa 10.24. Other electrolytes wnl.  Renal: SCr < 1, BUN WNL Hepatic: LFTs / tbili WNL, prealbumin 11.8>>13.5, albumin 2.2, TG down to 147 Intake / Output; MIVF: UOP not documented - 4 occurences / 24 hrs. NG removed 7/23. Colostomy 2100 ml.  - NS @75  ml/hr - vomited x2 on 7/22 so NG unclamped with output > now clamped again per RN *previous significant NG losses (up to 9L) and required 160-276mEq K per day *when NGT clamped, required ~80-131mEq K per day Pertinent GI Imaging:  7/12 CT abd - post-op ileus  7/12 Xray - gastric distention  7/16 Abx XR - mildly progressive SB ileus or partial obstruction GI Surgeries / Procedures:  7/6 ex-lap with sigmoid colectomy and end colostomy 7/21 EGD - many non-bleeding erosions, hiatal hernia, retained gastric fluid, distention deformity of entire stomach, biopsied  Central access: PICC placed 09/05/20 TPN start date: 09/05/20  Nutritional Goals (per RD rec on 7/23): 2150-2350  kCal, 100-115g AA, >2L fluid per day  Current Nutrition:  TPN (s/p 7d thiamine/folate in TPN for hx EtOH) FLD > NPO on 7/19 d/t vomiting > CLD 7/20 > NPO for EGD 7/21 and remained NPO after > FLD on 7/22 - Pt reported doing well with FLD yesterday reported eating broth, ice cream, and juice not all due to not wanting to over due it. No nausea/vomiting - Reported eating all broth and ice cream for breakfast and some juice and grits  Ensure TID - 2/3 charted as given per [facility] - pt reported consuming 1 full ensure   Plan:  Wean TPN to 45 ml/hr per surgery - this will provide 56g AA and 1156 kcal. Electrolytes in TPN: Continue Na 150 mEq/L, K 45 mEq/L (= 92 mEq/day), Ca 81mEq/L, Mag 1mEq/L, Phos 89mmol/L, Adjust Cl:Ac 2:1 Add standard MVI and trace elements to TPN Continue NS per MD Standard TPN labs on Mon and Thurs, Follow up advancement of diet and further weaning of TPN  12m, PharmD, BCPS Clinical Pharmacist 09/16/2020 8:00 AM

## 2020-09-16 NOTE — Progress Notes (Signed)
Progress Note  3 Days Post-Op  Subjective: Feeling well. No n/v last 24h. Tolerating FLD with NG out.   Objective: Vital signs in last 24 hours: Temp:  [98.1 F (36.7 C)-98.6 F (37 C)] 98.5 F (36.9 C) (07/24 0929) Pulse Rate:  [90-98] 93 (07/24 0929) Resp:  [16-17] 17 (07/24 0929) BP: (122-132)/(67-76) 122/67 (07/24 0929) SpO2:  [97 %-99 %] 98 % (07/24 0929) Last BM Date: 09/15/20  Intake/Output from previous day: 07/23 0701 - 07/24 0700 In: 2046.2 [P.O.:570; I.V.:1476.2] Out: 2100 [Stool:2100] Intake/Output this shift: No intake/output data recorded.  PE: Abd: still soft, minimal tenderness.  Midline wound granulating well.  Colostomy with viable stoma and 2.1L of output yesterday- very thin .   Lab Results:  Recent Labs    09/15/20 0449  WBC 15.4*  HGB 9.8*  HCT 30.1*  PLT 478*    BMET Recent Labs    09/15/20 0449 09/16/20 0323  NA 132* 134*  K 3.5 3.9  CL 98 96*  CO2 28 29  GLUCOSE 97 110*  BUN 8 7  CREATININE 0.50* 0.49*  CALCIUM 8.6* 8.8*    PT/INR No results for input(s): LABPROT, INR in the last 72 hours. CMP     Component Value Date/Time   NA 134 (L) 09/16/2020 0323   K 3.9 09/16/2020 0323   CL 96 (L) 09/16/2020 0323   CO2 29 09/16/2020 0323   GLUCOSE 110 (H) 09/16/2020 0323   BUN 7 09/16/2020 0323   CREATININE 0.49 (L) 09/16/2020 0323   CALCIUM 8.8 (L) 09/16/2020 0323   PROT 5.8 (L) 09/13/2020 0439   ALBUMIN 2.2 (L) 09/13/2020 0439   AST 19 09/13/2020 0439   ALT 29 09/13/2020 0439   ALKPHOS 91 09/13/2020 0439   BILITOT 0.5 09/13/2020 0439   GFRNONAA >60 09/16/2020 0323   Lipase     Component Value Date/Time   LIPASE 25 08/27/2020 1125       Studies/Results: No results found.  Anti-infectives: Anti-infectives (From admission, onward)    Start     Dose/Rate Route Frequency Ordered Stop   09/13/20 1800  erythromycin (E-MYCIN) tablet 250 mg        250 mg Oral 3 times daily with meals & bedtime 09/13/20 1547      08/28/20 2315  piperacillin-tazobactam (ZOSYN) IVPB 3.375 g  Status:  Discontinued        3.375 g 12.5 mL/hr over 240 Minutes Intravenous Every 8 hours 08/27/20 2308 09/06/20 0956   08/27/20 1515  piperacillin-tazobactam (ZOSYN) IVPB 3.375 g        3.375 g 12.5 mL/hr over 240 Minutes Intravenous  Once 08/27/20 1507 08/28/20 1901        Assessment/Plan POD 18 s/p Exploratory laparotomy, 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 Fredricka Bonine 08/29/20 - Pathology without malignancy - CT 7/12, 7/18 w/ ileus. No IAA - WBC downtrending - JP drain removed on 7/21 - BID WTD for midline - Mobilize - Pulm toilet - soft diet - reglan for ileus, 10mg  Q6hrs, add e-mycin 250 TID for motility as well -colostomy working well with over 2L out yesterday- add fiber supplement to try and thicken this up some. Balance may be difficult with promotility agents but would like to see output less than 1L. - WOC has seen and HH ordered. He has performed ostomy pouch changes independently. He reports his sister will help at discharge  -patient likely has significant gastroparesis explaining his chronic  N/V.  He is on reglan 10mg  q 6 and e-mycin.  Dietician has seen for post gastrectomy type diet so he can eat 5-6 small meals a day and take it slow to see how he tolerates this.   FEN: soft, wean TPN ID: zosyn 7/4>7/14 VTE: SCDs, Lovenox Foley: removed 7/7, voiding   Tobacco use - nicotine patch   HTN - norvasc Chronic N/V, likely gastroparesis ETOH abuse - was drinking 1/2-1 pint/day  LOS: 20 days    , MD [facility] Surgery 09/16/2020, 9:34 AM Please see Amion for pager number during day hours 7:00am-4:30pm

## 2020-09-17 ENCOUNTER — Encounter (HOSPITAL_COMMUNITY): Payer: Self-pay | Admitting: Gastroenterology

## 2020-09-17 LAB — DIFFERENTIAL
Abs Immature Granulocytes: 0.04 10*3/uL (ref 0.00–0.07)
Basophils Absolute: 0.1 10*3/uL (ref 0.0–0.1)
Basophils Relative: 0 %
Eosinophils Absolute: 0.4 10*3/uL (ref 0.0–0.5)
Eosinophils Relative: 4 %
Immature Granulocytes: 0 %
Lymphocytes Relative: 26 %
Lymphs Abs: 3.2 10*3/uL (ref 0.7–4.0)
Monocytes Absolute: 1.1 10*3/uL — ABNORMAL HIGH (ref 0.1–1.0)
Monocytes Relative: 9 %
Neutro Abs: 7.6 10*3/uL (ref 1.7–7.7)
Neutrophils Relative %: 61 %

## 2020-09-17 LAB — CBC
HCT: 28.7 % — ABNORMAL LOW (ref 39.0–52.0)
Hemoglobin: 9.3 g/dL — ABNORMAL LOW (ref 13.0–17.0)
MCH: 34.3 pg — ABNORMAL HIGH (ref 26.0–34.0)
MCHC: 32.4 g/dL (ref 30.0–36.0)
MCV: 105.9 fL — ABNORMAL HIGH (ref 80.0–100.0)
Platelets: 415 10*3/uL — ABNORMAL HIGH (ref 150–400)
RBC: 2.71 MIL/uL — ABNORMAL LOW (ref 4.22–5.81)
RDW: 14.8 % (ref 11.5–15.5)
WBC: 12.4 10*3/uL — ABNORMAL HIGH (ref 4.0–10.5)
nRBC: 0 % (ref 0.0–0.2)

## 2020-09-17 LAB — PHOSPHORUS: Phosphorus: 3.9 mg/dL (ref 2.5–4.6)

## 2020-09-17 LAB — COMPREHENSIVE METABOLIC PANEL
ALT: 25 U/L (ref 0–44)
AST: 20 U/L (ref 15–41)
Albumin: 2.2 g/dL — ABNORMAL LOW (ref 3.5–5.0)
Alkaline Phosphatase: 89 U/L (ref 38–126)
Anion gap: 8 (ref 5–15)
BUN: 6 mg/dL (ref 6–20)
CO2: 29 mmol/L (ref 22–32)
Calcium: 8.6 mg/dL — ABNORMAL LOW (ref 8.9–10.3)
Chloride: 97 mmol/L — ABNORMAL LOW (ref 98–111)
Creatinine, Ser: 0.53 mg/dL — ABNORMAL LOW (ref 0.61–1.24)
GFR, Estimated: >60 mL/min (ref >60)
Glucose, Bld: 88 mg/dL (ref 70–99)
Potassium: 3.3 mmol/L — ABNORMAL LOW (ref 3.5–5.1)
Sodium: 134 mmol/L — ABNORMAL LOW (ref 135–145)
Total Bilirubin: 0.4 mg/dL (ref 0.3–1.2)
Total Protein: 5.4 g/dL — ABNORMAL LOW (ref 6.5–8.1)

## 2020-09-17 LAB — MAGNESIUM: Magnesium: 1.8 mg/dL (ref 1.7–2.4)

## 2020-09-17 LAB — TRIGLYCERIDES: Triglycerides: 93 mg/dL (ref <150)

## 2020-09-17 LAB — PREALBUMIN: Prealbumin: 12.7 mg/dL — ABNORMAL LOW (ref 18–38)

## 2020-09-17 MED ORDER — POTASSIUM CHLORIDE IN NACL 40-0.9 MEQ/L-% IV SOLN
INTRAVENOUS | Status: DC
  Filled 2020-09-17 (×3): qty 1000

## 2020-09-17 MED ORDER — POTASSIUM CHLORIDE 10 MEQ/50ML IV SOLN
10.0000 meq | INTRAVENOUS | Status: AC
  Administered 2020-09-17 (×3): 10 meq via INTRAVENOUS
  Filled 2020-09-17 (×3): qty 50

## 2020-09-17 MED ORDER — CALCIUM POLYCARBOPHIL 625 MG PO TABS
625.0000 mg | ORAL_TABLET | Freq: Two times a day (BID) | ORAL | Status: DC
  Administered 2020-09-17 – 2020-09-19 (×4): 625 mg via ORAL
  Filled 2020-09-17 (×5): qty 1

## 2020-09-17 NOTE — TOC Progression Note (Signed)
Transition of Care [facility]) - Progression Note    Patient Details  Name: Ryan Velasquez MRN: [id number] Date of Birth: [birth year]  Transition of [facility]) CM/SW Contact  Epifanio Lesches, RN Phone Number: 09/17/2020, 3:00 PM  Clinical Narrative:    Updated clinicals faxed to Barbette Reichmann at [phone].  TOC team will continue to monitor and assist with TOC needs.  Expected Discharge Plan: Home w Home Health Services Barriers to Discharge: Continued Medical Work up  Expected Discharge Plan and Services Expected Discharge Plan: Home w Home Health Services   Discharge Planning Services: CM Consult Post Acute Care Choice: Home Health Living arrangements for the past 2 months: Single Family Home                   DME Agency: NA       HH Arranged: RN           Social Determinants of Health (SDOH) Interventions    Readmission Risk Interventions No flowsheet data found.

## 2020-09-17 NOTE — Progress Notes (Signed)
PHARMACY - TOTAL PARENTERAL NUTRITION CONSULT NOTE  Indication: Prolonged ileus  Patient Measurements: Height: 5\' 8"  (172.7 cm) Weight: 68.9 kg (151 lb 12.8 oz) IBW/kg (Calculated) : 68.4 TPN AdjBW (KG): 74.8 Body mass index is 23.08 kg/m. Usual Weight: 93 kg  Assessment:  [name] presented on 7/4 with N/V and abdominal pain x 2 days following constipation.  Found to have colonic obstruction with mass and abscess requiring ex-lap with sigmoid colectomy and end colostomy on 7/6.  Patient has nausea and vomiting post-op and was unable to start a diet. CT on 7/12 showed post-op ileus.  Pharmacy consulted for TPN management given inadequate intake for ~12 days.  Patient reports having COVID in Dec 2021 and intake has been reduced since.  He lost ~40 lbs in 7 months.  Patient is at risk for refeeding.  Glucose / Insulin: no hx DM - CBGs < 180 on BMET.  - SSI/CBG checks D/C'ed 7/16.  Electrolytes: Na 134 (on max Na in TPN 7/22), K 3.3 low likely from GI losses. Cl 97, CoCa 10. Other electrolytes wnl.  Renal: SCr < 1, BUN WNL Hepatic: LFTs / tbili WNL, prealbumin 13.5>12.7, albumin 2.2, TG down to 93 GI: Erythromycin QID, Reglan QID, Fibercon daily to thicken stool, IV PPI - PRN Levisin and simethicone Intake / Output; MIVF: UOP not quantified. NG removed 7/23. Colostomy 2150 ml remains high. - po 8/23 of soft diet vs Ensure x 3 - NS @75  ml/hr  *previous significant NG losses (up to 9L) and required 160-218mEq K per day *when NGT clamped, required ~80-140mEq K per day  Pertinent GI Imaging:  7/12 CT abd - post-op ileus  7/12 Xray - gastric distention  7/16 Abx XR - mildly progressive SB ileus or partial obstruction  GI Surgeries / Procedures:  7/6 ex-lap with sigmoid colectomy and end colostomy 7/21 EGD - many non-bleeding erosions, hiatal hernia, retained gastric fluid, distention deformity of entire stomach, biopsied  Central access: PICC placed 09/05/20 TPN start date:  09/05/20  Nutritional Goals (per RD rec on 7/23): 2150-2350 kCal, 100-115g AA, >2L fluid per day  Current Nutrition:  TPN (s/p 7d thiamine/folate in TPN for hx EtOH) Ensure TID: 3x charted 7/25.  Plan:  Wean TPN to OFF today per surgery Add standard MVI and trace elements to TPN Change IVF to NS + K40 at 35ml/hr to provide 72 meq K+ over 24h.(Goal K+ around 4 with resolving ileus) KCl 8/25 IV x 3 this AM. Avoid PO supplementation due to common side effect of nausea.   Ryan Kinkade S. 70m, PharmD, BCPS Clinical Staff Pharmacist [email or web address] 09/17/2020 7:33 AM

## 2020-09-17 NOTE — Progress Notes (Signed)
Progress Note  4 Days Post-Op  Subjective: NGT out, on soft diet.  No N/V for a couple of days.  Still with 2.1L of colostomy output yesterday.     Objective: Vital signs in last 24 hours: Temp:  [98 F (36.7 C)-98.4 F (36.9 C)] 98.3 F (36.8 C) (07/25 0737) Pulse Rate:  [82-96] 96 (07/25 0737) Resp:  [17-20] 20 (07/25 0737) BP: (121-136)/(68-74) 121/69 (07/25 0737) SpO2:  [98 %-99 %] 99 % (07/25 0737) Last BM Date: 09/16/20  Intake/Output from previous day: 07/24 0701 - 07/25 0700 In: 474 [P.O.:474] Out: 2150 [Stool:2150] Intake/Output this shift: Total I/O In: 240 [P.O.:240] Out: 500 [Stool:500]  PE: Abd: still soft, but more distended than when he has his NGT in place, minimal tenderness.  Midline wound granulating well.  Colostomy with viable stoma and 2.1L of output yesterday- very thin .   Lab Results:  Recent Labs    09/15/20 0449 09/17/20 0412  WBC 15.4* 12.4*  HGB 9.8* 9.3*  HCT 30.1* 28.7*  PLT 478* 415*   BMET Recent Labs    09/16/20 0323 09/17/20 0412  NA 134* 134*  K 3.9 3.3*  CL 96* 97*  CO2 29 29  GLUCOSE 110* 88  BUN 7 6  CREATININE 0.49* 0.53*  CALCIUM 8.8* 8.6*   PT/INR No results for input(s): LABPROT, INR in the last 72 hours. CMP     Component Value Date/Time   NA 134 (L) 09/17/2020 0412   K 3.3 (L) 09/17/2020 0412   CL 97 (L) 09/17/2020 0412   CO2 29 09/17/2020 0412   GLUCOSE 88 09/17/2020 0412   BUN 6 09/17/2020 0412   CREATININE 0.53 (L) 09/17/2020 0412   CALCIUM 8.6 (L) 09/17/2020 0412   PROT 5.4 (L) 09/17/2020 0412   ALBUMIN 2.2 (L) 09/17/2020 0412   AST 20 09/17/2020 0412   ALT 25 09/17/2020 0412   ALKPHOS 89 09/17/2020 0412   BILITOT 0.4 09/17/2020 0412   GFRNONAA >60 09/17/2020 0412   Lipase     Component Value Date/Time   LIPASE 25 08/27/2020 1125       Studies/Results: No results found.  Anti-infectives: Anti-infectives (From admission, onward)    Start     Dose/Rate Route Frequency Ordered  Stop   09/13/20 1800  erythromycin (E-MYCIN) tablet 250 mg        250 mg Oral 3 times daily with meals & bedtime 09/13/20 1547     08/28/20 2315  piperacillin-tazobactam (ZOSYN) IVPB 3.375 g  Status:  Discontinued        3.375 g 12.5 mL/hr over 240 Minutes Intravenous Every 8 hours 08/27/20 2308 09/06/20 0956   08/27/20 1515  piperacillin-tazobactam (ZOSYN) IVPB 3.375 g        3.375 g 12.5 mL/hr over 240 Minutes Intravenous  Once 08/27/20 1507 08/28/20 1901        Assessment/Plan POD 19, s/p Exploratory laparotomy, 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 Fredricka Bonine 08/29/20 - Pathology without malignancy - CT 7/12, 7/18 w/ ileus. No IAA - WBC downtrending - JP drain removed on 7/21 - BID WTD for midline - Mobilize - Pulm toilet - soft diet - reglan for ileus, 10mg  Q6hrs, add e-mycin 250 TID for motility as well -colostomy working well with over 2L out yesterday- increase fiber to BID to help with colostomy output - WOC has seen and HH ordered. He has performed ostomy pouch changes independently. He reports his sister  will help at discharge  -patient likely has significant gastroparesis explaining his chronic N/V.  He is on reglan 10mg  q 6 and e-mycin.  Dietician has seen for post gastrectomy type diet so he can eat 5-6 small meals a day and take it slow to see how he tolerates this.  Dispo - possibly home tomorrow pending colostomy output.   FEN: soft, turn TNA off today ID: zosyn 7/4>7/14 VTE: SCDs, Lovenox Foley: removed 7/7, voiding   Tobacco use - nicotine patch   HTN - norvasc Chronic N/V, likely gastroparesis ETOH abuse - was drinking 1/2-1 pint/day  LOS: 21 days    , [facility] Surgery 09/17/2020, 11:18 AM Please see Amion for pager number during day hours 7:00am-4:30pm

## 2020-09-18 LAB — BASIC METABOLIC PANEL
Anion gap: 8 (ref 5–15)
BUN: 5 mg/dL — ABNORMAL LOW (ref 6–20)
CO2: 27 mmol/L (ref 22–32)
Calcium: 9.1 mg/dL (ref 8.9–10.3)
Chloride: 100 mmol/L (ref 98–111)
Creatinine, Ser: 0.64 mg/dL (ref 0.61–1.24)
GFR, Estimated: >60 mL/min (ref >60)
Glucose, Bld: 106 mg/dL — ABNORMAL HIGH (ref 70–99)
Potassium: 4.4 mmol/L (ref 3.5–5.1)
Sodium: 135 mmol/L (ref 135–145)

## 2020-09-18 MED ORDER — PANTOPRAZOLE SODIUM 40 MG PO TBEC
40.0000 mg | DELAYED_RELEASE_TABLET | Freq: Every day | ORAL | Status: DC
  Administered 2020-09-18: 40 mg via ORAL
  Filled 2020-09-18: qty 1

## 2020-09-18 NOTE — Progress Notes (Signed)
Progress Note  5 Days Post-Op  Subjective: Soft diet and doing well.  Colostomy down to 1.1L yesterday.  No new complaints    Objective: Vital signs in last 24 hours: Temp:  [98.1 F (36.7 C)-98.4 F (36.9 C)] 98.2 F (36.8 C) (07/26 0709) Pulse Rate:  [86-97] 91 (07/26 0709) Resp:  [18-20] 19 (07/26 0709) BP: (114-137)/(61-72) 116/62 (07/26 0709) SpO2:  [97 %-100 %] 97 % (07/26 0709) Last BM Date: 09/16/20  Intake/Output from previous day: 07/25 0701 - 07/26 0700 In: 1672.4 [P.O.:480; I.V.:1192.4] Out: 1100 [Stool:1100] Intake/Output this shift: Total I/O In: -  Out: 300 [Stool:300]  PE: Abd: still soft, minimal tenderness.  Midline wound granulating well.  Colostomy with viable stoma and 1.1L of output yesterday.   Lab Results:  Recent Labs    09/17/20 0412  WBC 12.4*  HGB 9.3*  HCT 28.7*  PLT 415*   BMET Recent Labs    09/16/20 0323 09/17/20 0412  NA 134* 134*  K 3.9 3.3*  CL 96* 97*  CO2 29 29  GLUCOSE 110* 88  BUN 7 6  CREATININE 0.49* 0.53*  CALCIUM 8.8* 8.6*   PT/INR No results for input(s): LABPROT, INR in the last 72 hours. CMP     Component Value Date/Time   NA 134 (L) 09/17/2020 0412   K 3.3 (L) 09/17/2020 0412   CL 97 (L) 09/17/2020 0412   CO2 29 09/17/2020 0412   GLUCOSE 88 09/17/2020 0412   BUN 6 09/17/2020 0412   CREATININE 0.53 (L) 09/17/2020 0412   CALCIUM 8.6 (L) 09/17/2020 0412   PROT 5.4 (L) 09/17/2020 0412   ALBUMIN 2.2 (L) 09/17/2020 0412   AST 20 09/17/2020 0412   ALT 25 09/17/2020 0412   ALKPHOS 89 09/17/2020 0412   BILITOT 0.4 09/17/2020 0412   GFRNONAA >60 09/17/2020 0412   Lipase     Component Value Date/Time   LIPASE 25 08/27/2020 1125       Studies/Results: No results found.  Anti-infectives: Anti-infectives (From admission, onward)    Start     Dose/Rate Route Frequency Ordered Stop   09/13/20 1800  erythromycin (E-MYCIN) tablet 250 mg        250 mg Oral 3 times daily with meals & bedtime  09/13/20 1547     08/28/20 2315  piperacillin-tazobactam (ZOSYN) IVPB 3.375 g  Status:  Discontinued        3.375 g 12.5 mL/hr over 240 Minutes Intravenous Every 8 hours 08/27/20 2308 09/06/20 0956   08/27/20 1515  piperacillin-tazobactam (ZOSYN) IVPB 3.375 g        3.375 g 12.5 mL/hr over 240 Minutes Intravenous  Once 08/27/20 1507 08/28/20 1901        Assessment/Plan POD 20, s/p Exploratory laparotomy, 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 Fredricka Bonine 08/29/20 - Pathology without malignancy - CT 7/12, 7/18 w/ ileus. No IAA - WBC downtrending - JP drain removed on 7/21 - BID WTD for midline - Mobilize - Pulm toilet - soft diet - reglan for ileus, 10mg  Q6hrs, add e-mycin 250 TID for motility as well -colostomy working well with over 2L out yesterday- increase fiber to BID to help with colostomy output - WOC has seen and HH ordered. He has performed ostomy pouch changes independently. He reports his sister will help at discharge  -patient likely has significant gastroparesis explaining his chronic N/V.  He is on reglan 10mg  q 6 and e-mycin.  Dietician  has seen for post gastrectomy type diet so he can eat 5-6 small meals a day and take it slow to see how he tolerates this.  Dispo - possible DC home tomorrow pending colostomy output.   FEN: soft ID: zosyn 7/4>7/14 VTE: SCDs, Lovenox Foley: removed 7/7, voiding   Tobacco use - nicotine patch   HTN - norvasc Chronic N/V, likely gastroparesis ETOH abuse - was drinking 1/2-1 pint/day  LOS: 22 days    Letha Cape, [facility] Surgery 09/18/2020, 10:57 AM Please see Amion for pager number during day hours 7:00am-4:30pm

## 2020-09-19 ENCOUNTER — Other Ambulatory Visit (HOSPITAL_COMMUNITY): Payer: Self-pay

## 2020-09-19 MED ORDER — CALCIUM POLYCARBOPHIL 625 MG PO TABS: 625.0000 mg | ORAL_TABLET | Freq: Two times a day (BID) | ORAL | Status: DC

## 2020-09-19 MED ORDER — OXYCODONE HCL 5 MG PO TABS
5.0000 mg | ORAL_TABLET | Freq: Four times a day (QID) | ORAL | 0 refills | Status: DC | PRN
  Filled 2020-09-19: qty 25, 3d supply, fill #0

## 2020-09-19 MED ORDER — ERYTHROMYCIN BASE 250 MG PO TABS
250.0000 mg | ORAL_TABLET | Freq: Three times a day (TID) | ORAL | 0 refills | Status: AC
  Filled 2020-09-19: qty 40, 10d supply, fill #0

## 2020-09-19 MED ORDER — SIMETHICONE 80 MG PO CHEW: 80.0000 mg | CHEWABLE_TABLET | Freq: Four times a day (QID) | ORAL | 0 refills | Status: DC | PRN

## 2020-09-19 MED ORDER — METOCLOPRAMIDE HCL 10 MG PO TABS
10.0000 mg | ORAL_TABLET | Freq: Three times a day (TID) | ORAL | 0 refills | Status: DC
  Filled 2020-09-19: qty 80, 20d supply, fill #0

## 2020-09-19 MED ORDER — METHOCARBAMOL 500 MG PO TABS
1000.0000 mg | ORAL_TABLET | Freq: Three times a day (TID) | ORAL | 0 refills | Status: DC | PRN
  Filled 2020-09-19: qty 60, 10d supply, fill #0

## 2020-09-19 MED ORDER — ACETAMINOPHEN 500 MG PO TABS
1000.0000 mg | ORAL_TABLET | Freq: Four times a day (QID) | ORAL | 0 refills | Status: DC | PRN
Start: 2020-09-19 — End: 2021-02-08

## 2020-09-19 MED ORDER — ONDANSETRON HCL 4 MG PO TABS
4.0000 mg | ORAL_TABLET | Freq: Three times a day (TID) | ORAL | 0 refills | Status: DC | PRN
  Filled 2020-09-19: qty 30, 10d supply, fill #0

## 2020-09-19 NOTE — Progress Notes (Signed)
Pt discharged to home with sister who will assist with dressing changes at home.  She understands how to do this.   TOC delivered meds to pt.  Pt understands all DC instructions and copy given.

## 2020-09-19 NOTE — Plan of Care (Signed)
  Problem: Clinical Measurements: Goal: Ability to maintain clinical measurements within normal limits will improve Outcome: Progressing   Problem: Activity: Goal: Risk for activity intolerance will decrease Outcome: Adequate for Discharge   Problem: Nutrition: Goal: Adequate nutrition will be maintained Outcome: Progressing   Problem: Elimination: Goal: Will not experience complications related to bowel motility Outcome: Progressing   Problem: Pain Managment: Goal: General experience of comfort will improve Outcome: Progressing

## 2020-09-19 NOTE — Progress Notes (Signed)
Pt for DC today, PICC line being removed presently, pt to lie in bed for 30 min post removal. Reviewed with patsy Manson Passey, sister, how to do the dressing change, she has a good understanding of how to do the dressing and sterile technique. Supplies for ostomy are to be sent to sisters address, she is calling presently to order the supplies.

## 2020-09-19 NOTE — Discharge Summary (Signed)
Patient ID: Ryan Velasquez [id number] 1970-04-07 50 y.o.  Admit date: 08/27/2020 Discharge date: 09/19/2020  Admitting Diagnosis: Colonic obstruction with inflammation and abscess. Unclear etiology Diverticulitis vs cancer. Ileocecal valve appears incompetent.  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 Fredricka Bonine 08/29/20 Chronic N/V secondary to likely severe gastroparesis Tobacco use  HTN  ETOH abuse  Consultants Dr. Adela Lank, GI  Reason for Admission: Pt is a 50 yo M who presented to [facility] today with 2 days of n/v and abdominal pain following several days of constipation.  The last few days he also feels that he may have had a fever.  He is a current smoker.  His only real medical issue at this point is HTN.  He does have a history of alcohol abuse, but quit 3 weeks ago. His pain is worse when he moves and better when he is still.     He hasn't had a colonoscopy yet.   Procedures Dr. Fredricka Bonine, 08/29/20 Exploratory laparotomy, sigmoid colectomy with end colostomy for LBO 2/2 mass/abscess surrounding the sigmoid colon   Dr. Meridee Score, 09/13/20 EGD  Hospital Course:  The patient was admitted secondary to the above findings and underwent exploratory laparotomy with sigmoid colectomy and end colostomy for large bowel obstruction on August 29, 2020 by Dr. Fredricka Bonine.  His pathology resulted and was negative for malignancy.  The patient had an NG tube placed postoperatively.  He struggled with what was felt to be a postoperative ileus.  He had several attempts at clamping his NG tube but failed due to repeated nausea and vomiting.  He had several postoperative CT  scans with no acute complications with the exception of dilated bowel and stomach consistent with ileus.  His colostomy bag began working well despite continued nausea and vomiting.  Upon further inquisition, the patient was noted to have over 8 months of chronic nausea and vomiting and was being evaluated by gastroenterology.  They were consulted here and the patient underwent an upper endoscopy.  No significant findings were noted with the exception of a distended stomach consistent with likely severe gastroparesis.  The patient was placed on Reglan and ultimately placed on erythromycin as well as a prokinetic treatment.  On postop day 17, the patient was tolerating his NG tube clamped with these additions.  His diet was slowly advanced and a postgastrectomy type diet with 5-6 small meals a day.  His NG tube was able to be discontinued and he was started on a soft diet on postop day 19.  He continued to tolerate a diet without further nausea or vomiting at this time.  The patient had a new colostomy as well as an open midline wound and a JP drain.  This JP drain was able to be removed on 7/20 due to minimal output.  He was taught how to manage both his wound and his colostomy bag.  He did develop high colostomy output secondary to prokinetic agents.  He was started on twice daily fiber which resolved his high output but did not intervene in his gastroparesis.  He was treated with 10 days of antibiotic therapy.  No further antibiotics were needed after this discontinuation.  Due to  his prolonged course, he was started on TNA.  This was able to be weaned and stopped on postoperative day 19.  The patient's hypertension remained stable while in the hospital.  He was otherwise medically stable throughout his stay.  He was stable on postoperative day 21.  Physical Exam: Abd: still soft, minimal tenderness.  Midline wound granulating well.  Colostomy with viable stoma and 0.5L of output.  Allergies as of  09/19/2020   No Known Allergies      Medication List     STOP taking these medications    bisacodyl 10 MG suppository Commonly known as: DULCOLAX   bisacodyl 5 MG EC tablet Generic drug: bisacodyl   docusate sodium 100 MG capsule Commonly known as: COLACE   magnesium hydroxide 400 MG/5ML suspension Commonly known as: MILK OF MAGNESIA       TAKE these medications    acetaminophen 500 MG tablet Commonly known as: TYLENOL Take 2 tablets (1,000 mg total) by mouth every 6 (six) hours as needed for mild pain, moderate pain or fever.   Allergy Relief 10 MG tablet Generic drug: loratadine Take 10 mg by mouth daily.   amLODipine 5 MG tablet Commonly known as: NORVASC Take 5 mg by mouth daily.   diazepam 5 MG/ML solution Commonly known as: VALIUM Take 10 mg by mouth daily as needed (seizures). Give 10mg /59mL IM STAT and call on call medical staff. May repeat dose one time if seizure persists greater than 30 seconds after the first dose given.   erythromycin 250 MG tablet Commonly known as: E-MYCIN Take 1 tablet (250 mg total) by mouth 4 (four) times daily -  with meals and at bedtime for 20 days.   losartan 25 MG tablet Commonly known as: COZAAR Take 25 mg by mouth 2 (two) times daily.   methocarbamol 500 MG tablet Commonly known as: Robaxin Take 2 tablets (1,000 mg total) by mouth every 8 (eight) hours as needed for muscle spasms.   metoCLOPramide 10 MG tablet Commonly known as: REGLAN Take 1 tablet (10 mg total) by mouth 4 (four) times daily -  before meals and at bedtime for 20 days.   Multi Complete Caps Take 1 capsule by mouth daily.   omeprazole 20 MG capsule Commonly known as: PRILOSEC Take 20 mg by mouth daily.   ondansetron 4 MG tablet Commonly known as: Zofran Take 1 tablet (4 mg total) by mouth every 8 (eight) hours as needed for nausea or vomiting.   ondansetron 8 MG disintegrating tablet Commonly known as: ZOFRAN-ODT Take 8 mg by mouth every 8  (eight) hours as needed for nausea or vomiting.   oxyCODONE 5 MG immediate release tablet Commonly known as: Oxy IR/ROXICODONE Take 1-2 tablets (5-10 mg total) by mouth every 6 (six) hours as needed for severe pain.   polycarbophil 625 MG tablet Commonly known as: FIBERCON Take 1 tablet (625 mg total) by mouth 2 (two) times daily.   simethicone 80 MG chewable tablet Commonly known as: MYLICON Chew 1 tablet (80 mg total) by mouth 4 (four) times daily as needed for flatulence (gas pain).   thiamine 100 MG tablet Take 100 mg by mouth daily.   traZODone 50 MG tablet Commonly known as: DESYREL Take 50 mg by mouth at bedtime as needed for sleep.       ASK your doctor about these medications    metoprolol tartrate 25 MG tablet Commonly known as: LOPRESSOR Take 25 mg by mouth 2 (two) times daily.  Follow-up Information     [facility] Follow up.   Contact information: phone [phone]        Berna Bue, MD Follow up on 10/11/2020.   Specialty: General Surgery Why: 9:10am,  arrive by 8:40am for paperwork and check in process Contact information: [address] [place] [address] [phone]         Marney Setting, MD Follow up in 1 week(s).   Specialty: Gastroenterology Why: already scheduled appointment Contact information: [address] [place] [address] [phone]         Primary care provider Follow up.   Why: As needed                Signed: Barnetta Chapel, [facility] Surgery 09/19/2020, 11:10 AM Please see Amion for pager number during day hours 7:00am-4:30pm, 7-11:30am on Weekends

## 2020-12-07 ENCOUNTER — Ambulatory Visit: Payer: Self-pay | Admitting: Surgery

## 2020-12-07 DIAGNOSIS — Z933 Colostomy status: Secondary | ICD-10-CM

## 2020-12-07 NOTE — H&P (Signed)
  Ryan Velasquez [id number]  DATE OF ENCOUNTER: 12/07/2020 Interval History:  He has been doing well since her last visit.  He reports his energy level is back to normal, he is as active as he wants to be although does wonder if he is allowed to start doing any heavier weight lifting (he wants to cut up a log).  He has been eating well.  Denies any abdominal pain.  He was able to meet with the wound ostomy nurse in [place] and was very appreciative of the excellent advice he received and has had no further issues with his pouching.  He did have his colonoscopy as documented below. 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   Physical Examination:   There were no vitals filed for this visit.  Alert, well-appearing Unlabored respirations Abdomen is soft, nontender.  Incision healed.  Stoma left lower quadrant is pink and productive   Assessment and Plan:   Recovering appropriately status post exploratory laparotomy, sigmoid colectomy with end colostomy for obstruction from diverticulitis on 08/29/20. EGD on 09/13/20 for persistent nausea/vomiting and dx'd with gastroparesis, improved with dietary and medication changes.   Discussed expectations for ongoing recovery, activity limitations if applicable, and reasons to call.   Colonoscopy done 10/10 with Dr. Marney Setting.  Patient was kind enough to bring a copy of this today.  This was notable for a localized area of erythematous and nodular mucosa in the mid transverse colon, biopsies were taken.  Also noted diffuse area of mildly erythematous and friable with spontaneous bleeding mucosa in the rectum and sigmoid consistent with diversion colitis.  Pathology report notes colonic mucosa with mild lamina propria hemorrhage and a small lymphoid aggregate otherwise no significant pathology.    He feels is back to 100% as far as energy level, physical conditioning, though not as strong as he was before although.   He is cleared to return to any physical activity desired.  Advised him to ease into this.    We will plan to proceed with laparoscopic, possible open colostomy reversal in mid December which will be about 5-1/2 months after his initial operation.  We discussed the procedure in detail including typical perioperative recovery, risks of bleeding, infection, pain, scarring, injury to intra-abdominal structures, anastomotic leak or stricture, need for diverting loop ileostomy or return to the OR, need for further procedures, wound healing problems, hernia, ileus or obstruction, as well as cardiovascular/thromboembolic/pulmonary risks.  Questions welcomed and answered.  We will schedule him for the week of December 12 to 16.  He will need the MiraLAX/Dulcolax/neomycin/Flagyl prep ordered closer to the time of surgery.    Colostomy in place (CMS-HCC)   Gastroparesis   Tobacco use   Alcohol abuse, in remission   Ryan Chastain Carlye Grippe, MD

## 2021-01-23 NOTE — Patient Instructions (Addendum)
DUE TO COVID-19 ONLY ONE VISITOR IS ALLOWED TO COME WITH YOU AND STAY IN THE WAITING ROOM ONLY DURING PRE OP AND PROCEDURE.   **NO VISITORS ARE ALLOWED IN THE SHORT STAY AREA OR RECOVERY ROOM!!**  IF YOU WILL BE ADMITTED INTO THE HOSPITAL YOU ARE ALLOWED ONLY TWO SUPPORT PEOPLE DURING VISITATION HOURS ONLY (10AM -8PM)   The support person(s) may change daily. The support person(s) must pass our screening, gel in and out, and wear a mask at all times, including in the patient's room. Patients must also wear a mask when staff or their support person are in the room.  No visitors under the age of 21. Any visitor under the age of 59 must be accompanied by an adult.    COVID SWAB TESTING MUST BE COMPLETED ON:  02/08/21       Your procedure is scheduled on: 02/08/21   Report to [facility] Main Entrance    Report to admitting at 5:30 AM   Call this number if you have problems the morning of surgery [phone]   Follow bowel prep instructions given to by surgeons office the day before surgery   May have liquids until 5:30 AM day of surgery  CLEAR LIQUID DIET  Foods Allowed                                                                     Foods Excluded  Water, Black Coffee and tea (no milk or creamer)           liquids that you cannot  Plain Jell-O in any flavor  (No red)                                    see through such as: Fruit ices (not with fruit pulp)                                            milk, soups, orange juice              Iced Popsicles (No red)                                               All solid food                                   Apple juices  Sports drinks like Gatorade (No red) Lightly seasoned clear broth or consume(fat free) Sugar    Drink 2 Ensure drinks the night before surgery, have finished by 10pm.  Complete one Ensure drink the morning of surgery 3 hours prior to scheduled surgery by 5:30 AM     The day of surgery:  Drink ONE (1)  Pre-Surgery Clear Ensure by 5:30 am the morning of surgery. Drink in one sitting. Do not sip.  This drink was given to you during  your hospital  pre-op appointment visit. Nothing else to drink after completing the  Pre-Surgery Clear Ensure.          If you have questions, please contact your surgeon's office.     Oral Hygiene is also important to reduce your risk of infection.                                    Remember - BRUSH YOUR TEETH THE MORNING OF SURGERY WITH YOUR REGULAR TOOTHPASTE   Do NOT smoke after Midnight   Take these medicines the morning of surgery with A SIP OF WATER: Amlodipine                              You may not have any metal on your body including jewelry, and body piercing             Do not wear lotions, powders, cologne, or deodorant              Men may shave face and neck.   Do not bring valuables to the hospital. [facility] IS NOT             RESPONSIBLE   FOR VALUABLES.   Bring small overnight bag day of surgery.   Special Instructions: Bring a copy of your healthcare power of attorney and living will documents         the day of surgery if you haven't scanned them before.              Please read over the following fact sheets you were given: IF YOU HAVE QUESTIONS ABOUT YOUR PRE-OP INSTRUCTIONS PLEASE CALL [phone]- [facility] - Preparing for Surgery Before surgery, you can play an important role.  Because skin is not sterile, your skin needs to be as free of germs as possible.  You can reduce the number of germs on your skin by washing with CHG (chlorahexidine gluconate) soap before surgery.  CHG is an antiseptic cleaner which kills germs and bonds with the skin to continue killing germs even after washing. Please DO NOT use if you have an allergy to CHG or antibacterial soaps.  If your skin becomes reddened/irritated stop using the CHG and inform your nurse when you arrive at Short Stay. Do not shave (including legs and underarms)  for at least 48 hours prior to the first CHG shower.  You may shave your face/neck.  Please follow these instructions carefully:  1.  Shower with CHG Soap the night before surgery and the  morning of surgery.  2.  If you choose to wash your hair, wash your hair first as usual with your normal  shampoo.  3.  After you shampoo, rinse your hair and body thoroughly to remove the shampoo.                             4.  Use CHG as you would any other liquid soap.  You can apply chg directly to the skin and wash.  Gently with a scrungie or clean washcloth.  5.  Apply the CHG Soap to your body ONLY FROM THE NECK DOWN.   Do   not use on face/ open  Wound or open sores. Avoid contact with eyes, ears mouth and   genitals (private parts).                       Wash face,  Genitals (private parts) with your normal soap.             6.  Wash thoroughly, paying special attention to the area where your    surgery  will be performed.  7.  Thoroughly rinse your body with warm water from the neck down.  8.  DO NOT shower/wash with your normal soap after using and rinsing off the CHG Soap.                9.  Pat yourself dry with a clean towel.            10.  Wear clean pajamas.            11.  Place clean sheets on your bed the night of your first shower and do not  sleep with pets. Day of Surgery : Do not apply any lotions/deodorants the morning of surgery.  Please wear clean clothes to the hospital/surgery center.  FAILURE TO FOLLOW THESE INSTRUCTIONS MAY RESULT IN THE CANCELLATION OF YOUR SURGERY  PATIENT SIGNATURE_________________________________  NURSE SIGNATURE__________________________________  ________________________________________________________________________   Ryan Velasquez  An incentive spirometer is a tool that can help keep your lungs clear and active. This tool measures how well you are filling your lungs with each breath. Taking long deep breaths may  help reverse or decrease the chance of developing breathing (pulmonary) problems (especially infection) following: A long period of time when you are unable to move or be active. BEFORE THE PROCEDURE  If the spirometer includes an indicator to show your best effort, your nurse or respiratory therapist will set it to a desired goal. If possible, sit up straight or lean slightly forward. Try not to slouch. Hold the incentive spirometer in an upright position. INSTRUCTIONS FOR USE  Sit on the edge of your bed if possible, or sit up as far as you can in bed or on a chair. Hold the incentive spirometer in an upright position. Breathe out normally. Place the mouthpiece in your mouth and seal your lips tightly around it. Breathe in slowly and as deeply as possible, raising the piston or the ball toward the top of the column. Hold your breath for 3-5 seconds or for as long as possible. Allow the piston or ball to fall to the bottom of the column. Remove the mouthpiece from your mouth and breathe out normally. Rest for a few seconds and repeat Steps 1 through 7 at least 10 times every 1-2 hours when you are awake. Take your time and take a few normal breaths between deep breaths. The spirometer may include an indicator to show your best effort. Use the indicator as a goal to work toward during each repetition. After each set of 10 deep breaths, practice coughing to be sure your lungs are clear. If you have an incision (the cut made at the time of surgery), support your incision when coughing by placing a pillow or rolled up towels firmly against it. Once you are able to get out of bed, walk around indoors and cough well. You may stop using the incentive spirometer when instructed by your caregiver.  RISKS AND COMPLICATIONS Take your time so you do not get dizzy or light-headed. If you are in pain, you may  need to take or ask for pain medication before doing incentive spirometry. It is harder to take a  deep breath if you are having pain. AFTER USE Rest and breathe slowly and easily. It can be helpful to keep track of a log of your progress. Your caregiver can provide you with a simple table to help with this. If you are using the spirometer at home, follow these instructions: SEEK MEDICAL CARE IF:  You are having difficultly using the spirometer. You have trouble using the spirometer as often as instructed. Your pain medication is not giving enough relief while using the spirometer. You develop fever of 100.5 F (38.1 C) or higher. SEEK IMMEDIATE MEDICAL CARE IF:  You cough up bloody sputum that had not been present before. You develop fever of 102 F (38.9 C) or greater. You develop worsening pain at or near the incision site. MAKE SURE YOU:  Understand these instructions. Will watch your condition. Will get help right away if you are not doing well or get worse. Document Released: 06/23/2006 Document Revised: 05/05/2011 Document Reviewed: 08/24/2006 [facility] Patient Information 2014 [place], [place].   ________________________________________________________________________

## 2021-01-23 NOTE — Progress Notes (Addendum)
COVID swab appointment: 02/06/21  COVID Vaccine Completed: no Date COVID Vaccine completed: Has received booster: COVID vaccine manufacturer: [employer]   Date of COVID positive in last 90 days: no  Bowel Prep no instructions given. Pt will call surgeons office  PCP - Dr. Vedia Coffer Cardiologist - n/a  Chest x-ray - CT 08/14/20 Care EKG - 09/06/20 Epic Stress Test - n/a ECHO - n/a Cardiac Cath - n/a Pacemaker/ICD device last checked: n/a Spinal Cord Stimulator: n/a  Sleep Study - n/a CPAP -   Fasting Blood Sugar - n/a Checks Blood Sugar _____ times a day  Blood Thinner Instructions:  Aspirin Instructions: ASA 81, no instructions given Last Dose:  Activity level: Can go up a flight of stairs and perform activities of daily living without stopping and without symptoms of chest pain or shortness of breath.    Anesthesia review:   Patient denies shortness of breath, fever, cough and chest pain at PAT appointment   Patient verbalized understanding of instructions that were given to them at the PAT appointment. Patient was also instructed that they will need to review over the PAT instructions again at home before surgery.

## 2021-01-24 ENCOUNTER — Encounter (HOSPITAL_COMMUNITY)
Admission: RE | Admit: 2021-01-24 | Discharge: 2021-01-24 | Disposition: A | Discharge status: Home / Self Care | Payer: BC Managed Care – PPO | Source: Ambulatory Visit | Attending: Surgery | Admitting: Surgery

## 2021-01-24 ENCOUNTER — Encounter (HOSPITAL_COMMUNITY): Payer: Self-pay

## 2021-01-24 ENCOUNTER — Other Ambulatory Visit: Payer: Self-pay

## 2021-01-24 DIAGNOSIS — Z933 Colostomy status: Secondary | ICD-10-CM | POA: Insufficient documentation

## 2021-01-24 DIAGNOSIS — Z01812 Encounter for preprocedural laboratory examination: Secondary | ICD-10-CM | POA: Insufficient documentation

## 2021-01-24 HISTORY — DX: Pneumonia, unspecified organism: J18.9

## 2021-01-24 LAB — CBC WITH DIFFERENTIAL/PLATELET
Abs Immature Granulocytes: 0.03 10*3/uL (ref 0.00–0.07)
Basophils Absolute: 0.1 10*3/uL (ref 0.0–0.1)
Basophils Relative: 1 %
Eosinophils Absolute: 0.2 10*3/uL (ref 0.0–0.5)
Eosinophils Relative: 2 %
HCT: 46.7 % (ref 39.0–52.0)
Hemoglobin: 15.5 g/dL (ref 13.0–17.0)
Immature Granulocytes: 0 %
Lymphocytes Relative: 29 %
Lymphs Abs: 2.6 10*3/uL (ref 0.7–4.0)
MCH: 34.1 pg — ABNORMAL HIGH (ref 26.0–34.0)
MCHC: 33.2 g/dL (ref 30.0–36.0)
MCV: 102.9 fL — ABNORMAL HIGH (ref 80.0–100.0)
Monocytes Absolute: 0.7 10*3/uL (ref 0.1–1.0)
Monocytes Relative: 8 %
Neutro Abs: 5.4 10*3/uL (ref 1.7–7.7)
Neutrophils Relative %: 60 %
Platelets: 371 10*3/uL (ref 150–400)
RBC: 4.54 MIL/uL (ref 4.22–5.81)
RDW: 14.5 % (ref 11.5–15.5)
WBC: 9.1 10*3/uL (ref 4.0–10.5)
nRBC: 0 % (ref 0.0–0.2)

## 2021-01-24 LAB — COMPREHENSIVE METABOLIC PANEL
ALT: 35 U/L (ref 0–44)
AST: 25 U/L (ref 15–41)
Albumin: 4.1 g/dL (ref 3.5–5.0)
Alkaline Phosphatase: 74 U/L (ref 38–126)
Anion gap: 6 (ref 5–15)
BUN: 9 mg/dL (ref 6–20)
CO2: 27 mmol/L (ref 22–32)
Calcium: 9.2 mg/dL (ref 8.9–10.3)
Chloride: 105 mmol/L (ref 98–111)
Creatinine, Ser: 0.84 mg/dL (ref 0.61–1.24)
GFR, Estimated: >60 mL/min (ref >60)
Glucose, Bld: 89 mg/dL (ref 70–99)
Potassium: 4.7 mmol/L (ref 3.5–5.1)
Sodium: 138 mmol/L (ref 135–145)
Total Bilirubin: 0.5 mg/dL (ref 0.3–1.2)
Total Protein: 7.8 g/dL (ref 6.5–8.1)

## 2021-01-24 LAB — HEMOGLOBIN A1C
Hgb A1c MFr Bld: 5.6 % (ref 4.8–5.6)
Mean Plasma Glucose: 114.02 mg/dL

## 2021-02-08 ENCOUNTER — Inpatient Hospital Stay (HOSPITAL_COMMUNITY): Payer: BC Managed Care – PPO | Admitting: Certified Registered Nurse Anesthetist

## 2021-02-08 ENCOUNTER — Encounter (HOSPITAL_COMMUNITY)
Admission: RE | Disposition: A | Discharge status: Home / Self Care | Payer: Self-pay | Source: Home / Self Care | Attending: Surgery

## 2021-02-08 ENCOUNTER — Encounter (HOSPITAL_COMMUNITY): Payer: Self-pay | Admitting: Surgery

## 2021-02-08 ENCOUNTER — Inpatient Hospital Stay (HOSPITAL_COMMUNITY)
Admission: RE | Admit: 2021-02-08 | Discharge: 2021-02-12 | DRG: 336 | Disposition: A | Discharge status: Home / Self Care | Payer: BC Managed Care – PPO | Attending: Surgery | Admitting: Surgery

## 2021-02-08 ENCOUNTER — Other Ambulatory Visit: Payer: Self-pay

## 2021-02-08 DIAGNOSIS — Z9889 Other specified postprocedural states: Secondary | ICD-10-CM | POA: Diagnosis present

## 2021-02-08 DIAGNOSIS — F1011 Alcohol abuse, in remission: Secondary | ICD-10-CM | POA: Diagnosis present

## 2021-02-08 DIAGNOSIS — K5289 Other specified noninfective gastroenteritis and colitis: Secondary | ICD-10-CM | POA: Diagnosis present

## 2021-02-08 DIAGNOSIS — Z433 Encounter for attention to colostomy: Secondary | ICD-10-CM | POA: Diagnosis present

## 2021-02-08 DIAGNOSIS — K660 Peritoneal adhesions (postprocedural) (postinfection): Secondary | ICD-10-CM | POA: Diagnosis present

## 2021-02-08 DIAGNOSIS — K567 Ileus, unspecified: Secondary | ICD-10-CM | POA: Diagnosis present

## 2021-02-08 DIAGNOSIS — Z20822 Contact with and (suspected) exposure to covid-19: Secondary | ICD-10-CM | POA: Diagnosis present

## 2021-02-08 DIAGNOSIS — K3184 Gastroparesis: Secondary | ICD-10-CM | POA: Diagnosis present

## 2021-02-08 HISTORY — PX: COLOSTOMY TAKEDOWN: SHX5258

## 2021-02-08 LAB — SARS CORONAVIRUS 2 BY RT PCR (HOSPITAL ORDER, PERFORMED IN ~~LOC~~ HOSPITAL LAB): SARS Coronavirus 2: NEGATIVE

## 2021-02-08 SURGERY — CLOSURE, COLOSTOMY, LAPAROSCOPIC
Anesthesia: General | Site: Abdomen

## 2021-02-08 MED ORDER — ACETAMINOPHEN 10 MG/ML IV SOLN: 1000.0000 mg | Freq: Once | INTRAVENOUS | Status: DC | PRN

## 2021-02-08 MED ORDER — DEXAMETHASONE SODIUM PHOSPHATE 10 MG/ML IJ SOLN
INTRAMUSCULAR | Status: AC
  Filled 2021-02-08: qty 1

## 2021-02-08 MED ORDER — ALVIMOPAN 12 MG PO CAPS
12.0000 mg | ORAL_CAPSULE | Freq: Two times a day (BID) | ORAL | Status: DC
  Administered 2021-02-09 – 2021-02-11 (×2): 12 mg via ORAL
  Filled 2021-02-08 (×3): qty 1

## 2021-02-08 MED ORDER — OXYCODONE HCL 5 MG/5ML PO SOLN: 5.0000 mg | Freq: Once | ORAL | Status: AC | PRN

## 2021-02-08 MED ORDER — ONDANSETRON HCL 4 MG/2ML IJ SOLN
INTRAMUSCULAR | Status: AC
  Filled 2021-02-08: qty 2

## 2021-02-08 MED ORDER — PHENYLEPHRINE 40 MCG/ML (10ML) SYRINGE FOR IV PUSH (FOR BLOOD PRESSURE SUPPORT)
PREFILLED_SYRINGE | INTRAVENOUS | Status: DC | PRN
  Administered 2021-02-08: 80 ug via INTRAVENOUS
  Administered 2021-02-08 (×2): 120 ug via INTRAVENOUS

## 2021-02-08 MED ORDER — ONDANSETRON HCL 4 MG/2ML IJ SOLN
4.0000 mg | Freq: Four times a day (QID) | INTRAMUSCULAR | Status: DC | PRN
  Administered 2021-02-09 – 2021-02-10 (×2): 4 mg via INTRAVENOUS
  Filled 2021-02-08 (×2): qty 2

## 2021-02-08 MED ORDER — ENSURE PRE-SURGERY PO LIQD
296.0000 mL | Freq: Once | ORAL | Status: DC
  Filled 2021-02-08: qty 296

## 2021-02-08 MED ORDER — ENSURE PRE-SURGERY PO LIQD: 592.0000 mL | Freq: Once | ORAL | Status: DC

## 2021-02-08 MED ORDER — LACTATED RINGERS IV SOLN: INTRAVENOUS | Status: DC

## 2021-02-08 MED ORDER — BUPIVACAINE-EPINEPHRINE (PF) 0.25% -1:200000 IJ SOLN
INTRAMUSCULAR | Status: AC
  Filled 2021-02-08: qty 30

## 2021-02-08 MED ORDER — ALVIMOPAN 12 MG PO CAPS
12.0000 mg | ORAL_CAPSULE | ORAL | Status: AC
  Administered 2021-02-08: 12 mg via ORAL
  Filled 2021-02-08: qty 1

## 2021-02-08 MED ORDER — CHLORHEXIDINE GLUCONATE 4 % EX LIQD: 60.0000 mL | Freq: Once | CUTANEOUS | Status: DC

## 2021-02-08 MED ORDER — OXYCODONE HCL 5 MG PO TABS
5.0000 mg | ORAL_TABLET | Freq: Four times a day (QID) | ORAL | Status: DC | PRN
  Administered 2021-02-08 – 2021-02-11 (×8): 5 mg via ORAL
  Filled 2021-02-08 (×9): qty 1

## 2021-02-08 MED ORDER — AMLODIPINE BESYLATE 5 MG PO TABS
5.0000 mg | ORAL_TABLET | Freq: Every day | ORAL | Status: DC
  Administered 2021-02-09 – 2021-02-12 (×4): 5 mg via ORAL
  Filled 2021-02-08 (×4): qty 1

## 2021-02-08 MED ORDER — POLYETHYLENE GLYCOL 3350 17 GM/SCOOP PO POWD: 1.0000 | Freq: Once | ORAL | Status: DC

## 2021-02-08 MED ORDER — FENTANYL CITRATE (PF) 250 MCG/5ML IJ SOLN
INTRAMUSCULAR | Status: DC | PRN
  Administered 2021-02-08: 100 ug via INTRAVENOUS
  Administered 2021-02-08 (×5): 50 ug via INTRAVENOUS

## 2021-02-08 MED ORDER — BISACODYL 5 MG PO TBEC: 20.0000 mg | DELAYED_RELEASE_TABLET | Freq: Once | ORAL | Status: DC

## 2021-02-08 MED ORDER — ORAL CARE MOUTH RINSE: 15.0000 mL | Freq: Once | OROMUCOSAL | Status: AC

## 2021-02-08 MED ORDER — PHENYLEPHRINE 40 MCG/ML (10ML) SYRINGE FOR IV PUSH (FOR BLOOD PRESSURE SUPPORT)
PREFILLED_SYRINGE | INTRAVENOUS | Status: AC
  Filled 2021-02-08: qty 10

## 2021-02-08 MED ORDER — KCL IN DEXTROSE-NACL 20-5-0.45 MEQ/L-%-% IV SOLN
INTRAVENOUS | Status: DC
  Filled 2021-02-08 (×8): qty 1000

## 2021-02-08 MED ORDER — HEPARIN SODIUM (PORCINE) 5000 UNIT/ML IJ SOLN
5000.0000 [IU] | Freq: Once | INTRAMUSCULAR | Status: AC
  Administered 2021-02-08: 5000 [IU] via SUBCUTANEOUS
  Filled 2021-02-08: qty 1

## 2021-02-08 MED ORDER — GABAPENTIN 300 MG PO CAPS
300.0000 mg | ORAL_CAPSULE | ORAL | Status: AC
  Administered 2021-02-08: 300 mg via ORAL
  Filled 2021-02-08: qty 1

## 2021-02-08 MED ORDER — LACTATED RINGERS IR SOLN
Status: DC | PRN
  Administered 2021-02-08: 1000 mL

## 2021-02-08 MED ORDER — OXYCODONE HCL 5 MG PO TABS: 5.0000 mg | ORAL_TABLET | Freq: Once | ORAL | Status: AC | PRN

## 2021-02-08 MED ORDER — METHOCARBAMOL 500 MG IVPB - SIMPLE MED
500.0000 mg | Freq: Four times a day (QID) | INTRAVENOUS | Status: DC | PRN
  Administered 2021-02-10: 500 mg via INTRAVENOUS
  Filled 2021-02-08 (×2): qty 50
  Filled 2021-02-08: qty 500

## 2021-02-08 MED ORDER — GABAPENTIN 300 MG PO CAPS
300.0000 mg | ORAL_CAPSULE | Freq: Two times a day (BID) | ORAL | Status: DC
  Administered 2021-02-08 – 2021-02-12 (×8): 300 mg via ORAL
  Filled 2021-02-08 (×8): qty 1

## 2021-02-08 MED ORDER — METOPROLOL TARTRATE 5 MG/5ML IV SOLN: 5.0000 mg | Freq: Four times a day (QID) | INTRAVENOUS | Status: DC | PRN

## 2021-02-08 MED ORDER — ENOXAPARIN SODIUM 40 MG/0.4ML IJ SOSY
40.0000 mg | PREFILLED_SYRINGE | INTRAMUSCULAR | Status: DC
  Administered 2021-02-09 – 2021-02-12 (×4): 40 mg via SUBCUTANEOUS
  Filled 2021-02-08 (×4): qty 0.4

## 2021-02-08 MED ORDER — METRONIDAZOLE 500 MG PO TABS: 1000.0000 mg | ORAL_TABLET | ORAL | Status: DC

## 2021-02-08 MED ORDER — LIDOCAINE 2% (20 MG/ML) 5 ML SYRINGE
INTRAMUSCULAR | Status: DC | PRN
  Administered 2021-02-08: 40 mg via INTRAVENOUS

## 2021-02-08 MED ORDER — ACETAMINOPHEN 500 MG PO TABS
1000.0000 mg | ORAL_TABLET | ORAL | Status: AC
  Administered 2021-02-08: 1000 mg via ORAL
  Filled 2021-02-08: qty 2

## 2021-02-08 MED ORDER — ONDANSETRON HCL 4 MG/2ML IJ SOLN
INTRAMUSCULAR | Status: DC | PRN
  Administered 2021-02-08: 4 mg via INTRAVENOUS

## 2021-02-08 MED ORDER — MIDAZOLAM HCL 5 MG/5ML IJ SOLN
INTRAMUSCULAR | Status: DC | PRN
  Administered 2021-02-08: 2 mg via INTRAVENOUS

## 2021-02-08 MED ORDER — KETOROLAC TROMETHAMINE 15 MG/ML IJ SOLN
15.0000 mg | Freq: Four times a day (QID) | INTRAMUSCULAR | Status: DC | PRN
  Administered 2021-02-08: 15 mg via INTRAVENOUS
  Filled 2021-02-08: qty 1

## 2021-02-08 MED ORDER — HYDRALAZINE HCL 20 MG/ML IJ SOLN: 10.0000 mg | INTRAMUSCULAR | Status: DC | PRN

## 2021-02-08 MED ORDER — MIDAZOLAM HCL 2 MG/2ML IJ SOLN
INTRAMUSCULAR | Status: AC
  Filled 2021-02-08: qty 2

## 2021-02-08 MED ORDER — FENTANYL CITRATE PF 50 MCG/ML IJ SOSY
25.0000 ug | PREFILLED_SYRINGE | INTRAMUSCULAR | Status: DC | PRN
  Administered 2021-02-08 (×2): 50 ug via INTRAVENOUS

## 2021-02-08 MED ORDER — ACETAMINOPHEN 500 MG PO TABS
1000.0000 mg | ORAL_TABLET | Freq: Four times a day (QID) | ORAL | Status: DC
  Administered 2021-02-08 – 2021-02-12 (×13): 1000 mg via ORAL
  Filled 2021-02-08 (×13): qty 2

## 2021-02-08 MED ORDER — SUGAMMADEX SODIUM 200 MG/2ML IV SOLN
INTRAVENOUS | Status: DC | PRN
  Administered 2021-02-08: 200 mg via INTRAVENOUS

## 2021-02-08 MED ORDER — ENSURE SURGERY PO LIQD
237.0000 mL | Freq: Two times a day (BID) | ORAL | Status: DC
  Administered 2021-02-11 – 2021-02-12 (×2): 237 mL via ORAL

## 2021-02-08 MED ORDER — LIDOCAINE HCL 2 % IJ SOLN
INTRAMUSCULAR | Status: AC
  Filled 2021-02-08: qty 20

## 2021-02-08 MED ORDER — SACCHAROMYCES BOULARDII 250 MG PO CAPS
250.0000 mg | ORAL_CAPSULE | Freq: Two times a day (BID) | ORAL | Status: DC
  Administered 2021-02-08 – 2021-02-12 (×8): 250 mg via ORAL
  Filled 2021-02-08 (×8): qty 1

## 2021-02-08 MED ORDER — ALUM & MAG HYDROXIDE-SIMETH 200-200-20 MG/5ML PO SUSP
30.0000 mL | Freq: Four times a day (QID) | ORAL | Status: DC | PRN
  Administered 2021-02-08 – 2021-02-10 (×2): 30 mL via ORAL
  Filled 2021-02-08 (×2): qty 30

## 2021-02-08 MED ORDER — BUPIVACAINE LIPOSOME 1.3 % IJ SUSP: 20.0000 mL | Freq: Once | INTRAMUSCULAR | Status: DC

## 2021-02-08 MED ORDER — LIDOCAINE HCL (PF) 2 % IJ SOLN
INTRAMUSCULAR | Status: AC
  Filled 2021-02-08: qty 5

## 2021-02-08 MED ORDER — FENTANYL CITRATE (PF) 100 MCG/2ML IJ SOLN
INTRAMUSCULAR | Status: AC
  Filled 2021-02-08: qty 2

## 2021-02-08 MED ORDER — SODIUM CHLORIDE 0.9 % IV SOLN
2.0000 g | INTRAVENOUS | Status: AC
  Administered 2021-02-08: 2 g via INTRAVENOUS
  Filled 2021-02-08: qty 2

## 2021-02-08 MED ORDER — KETAMINE HCL 10 MG/ML IJ SOLN
INTRAMUSCULAR | Status: DC | PRN
  Administered 2021-02-08: 50 mg via INTRAVENOUS

## 2021-02-08 MED ORDER — DIPHENHYDRAMINE HCL 50 MG/ML IJ SOLN: 25.0000 mg | Freq: Four times a day (QID) | INTRAMUSCULAR | Status: DC | PRN

## 2021-02-08 MED ORDER — PROPOFOL 10 MG/ML IV BOLUS
INTRAVENOUS | Status: AC
  Filled 2021-02-08: qty 20

## 2021-02-08 MED ORDER — SIMETHICONE 80 MG PO CHEW: 40.0000 mg | CHEWABLE_TABLET | Freq: Four times a day (QID) | ORAL | Status: DC | PRN

## 2021-02-08 MED ORDER — ROCURONIUM BROMIDE 10 MG/ML (PF) SYRINGE
PREFILLED_SYRINGE | INTRAVENOUS | Status: AC
  Filled 2021-02-08: qty 10

## 2021-02-08 MED ORDER — 0.9 % SODIUM CHLORIDE (POUR BTL) OPTIME
TOPICAL | Status: DC | PRN
  Administered 2021-02-08: 4000 mL

## 2021-02-08 MED ORDER — CHLORHEXIDINE GLUCONATE 0.12 % MT SOLN
15.0000 mL | Freq: Once | OROMUCOSAL | Status: AC
  Administered 2021-02-08: 15 mL via OROMUCOSAL

## 2021-02-08 MED ORDER — ACETAMINOPHEN 325 MG PO TABS: 325.0000 mg | ORAL_TABLET | ORAL | Status: DC | PRN

## 2021-02-08 MED ORDER — AMISULPRIDE (ANTIEMETIC) 5 MG/2ML IV SOLN: 10.0000 mg | Freq: Once | INTRAVENOUS | Status: DC | PRN

## 2021-02-08 MED ORDER — ROCURONIUM BROMIDE 10 MG/ML (PF) SYRINGE
PREFILLED_SYRINGE | INTRAVENOUS | Status: DC | PRN
  Administered 2021-02-08: 20 mg via INTRAVENOUS
  Administered 2021-02-08: 70 mg via INTRAVENOUS

## 2021-02-08 MED ORDER — PHENYLEPHRINE HCL-NACL 20-0.9 MG/250ML-% IV SOLN
INTRAVENOUS | Status: DC | PRN
  Administered 2021-02-08: 35 ug/min via INTRAVENOUS

## 2021-02-08 MED ORDER — ONDANSETRON HCL 4 MG PO TABS: 4.0000 mg | ORAL_TABLET | Freq: Four times a day (QID) | ORAL | Status: DC | PRN

## 2021-02-08 MED ORDER — TRAMADOL HCL 50 MG PO TABS
50.0000 mg | ORAL_TABLET | Freq: Four times a day (QID) | ORAL | Status: DC | PRN
  Administered 2021-02-08: 50 mg via ORAL
  Filled 2021-02-08: qty 1

## 2021-02-08 MED ORDER — PROMETHAZINE HCL 25 MG/ML IJ SOLN: 6.2500 mg | INTRAMUSCULAR | Status: DC | PRN

## 2021-02-08 MED ORDER — BUPIVACAINE-EPINEPHRINE 0.25% -1:200000 IJ SOLN
INTRAMUSCULAR | Status: DC | PRN
  Administered 2021-02-08: 30 mL

## 2021-02-08 MED ORDER — NEOMYCIN SULFATE 500 MG PO TABS: 1000.0000 mg | ORAL_TABLET | ORAL | Status: DC

## 2021-02-08 MED ORDER — DEXAMETHASONE SODIUM PHOSPHATE 10 MG/ML IJ SOLN
INTRAMUSCULAR | Status: DC | PRN
  Administered 2021-02-08: 6 mg via INTRAVENOUS

## 2021-02-08 MED ORDER — ACETAMINOPHEN 160 MG/5ML PO SOLN: 325.0000 mg | ORAL | Status: DC | PRN

## 2021-02-08 MED ORDER — DIPHENHYDRAMINE HCL 25 MG PO CAPS: 25.0000 mg | ORAL_CAPSULE | Freq: Four times a day (QID) | ORAL | Status: DC | PRN

## 2021-02-08 MED ORDER — METHOCARBAMOL 500 MG IVPB - SIMPLE MED
INTRAVENOUS | Status: AC
  Administered 2021-02-08 – 2021-02-09 (×2): 500 mg via INTRAVENOUS
  Filled 2021-02-08: qty 50

## 2021-02-08 MED ORDER — SODIUM CHLORIDE 0.9 % IV SOLN
2.0000 g | Freq: Two times a day (BID) | INTRAVENOUS | Status: AC
  Administered 2021-02-08: 2 g via INTRAVENOUS
  Filled 2021-02-08: qty 2

## 2021-02-08 MED ORDER — HYDROMORPHONE HCL 1 MG/ML IJ SOLN
0.5000 mg | INTRAMUSCULAR | Status: DC | PRN
  Administered 2021-02-09 – 2021-02-10 (×6): 0.5 mg via INTRAVENOUS
  Filled 2021-02-08 (×6): qty 0.5

## 2021-02-08 MED ORDER — CHLORHEXIDINE GLUCONATE CLOTH 2 % EX PADS
6.0000 | MEDICATED_PAD | Freq: Every day | CUTANEOUS | Status: DC
  Administered 2021-02-08 – 2021-02-11 (×2): 6 via TOPICAL

## 2021-02-08 MED ORDER — BUPIVACAINE LIPOSOME 1.3 % IJ SUSP
INTRAMUSCULAR | Status: AC
  Filled 2021-02-08: qty 20

## 2021-02-08 MED ORDER — FENTANYL CITRATE PF 50 MCG/ML IJ SOSY
PREFILLED_SYRINGE | INTRAMUSCULAR | Status: AC
  Administered 2021-02-08: 50 ug via INTRAVENOUS
  Filled 2021-02-08: qty 3

## 2021-02-08 MED ORDER — FENTANYL CITRATE (PF) 250 MCG/5ML IJ SOLN
INTRAMUSCULAR | Status: AC
  Filled 2021-02-08: qty 5

## 2021-02-08 MED ORDER — PROPOFOL 10 MG/ML IV BOLUS
INTRAVENOUS | Status: DC | PRN
  Administered 2021-02-08: 150 mg via INTRAVENOUS

## 2021-02-08 MED ORDER — KETAMINE HCL-SODIUM CHLORIDE 100-0.9 MG/10ML-% IV SOSY
PREFILLED_SYRINGE | INTRAVENOUS | Status: AC
  Filled 2021-02-08: qty 10

## 2021-02-08 MED ORDER — METOCLOPRAMIDE HCL 5 MG/ML IJ SOLN: 10.0000 mg | Freq: Four times a day (QID) | INTRAMUSCULAR | Status: DC | PRN

## 2021-02-08 MED ORDER — OXYCODONE HCL 5 MG PO TABS
ORAL_TABLET | ORAL | Status: AC
  Administered 2021-02-08: 5 mg via ORAL
  Filled 2021-02-08: qty 1

## 2021-02-08 MED ORDER — LIDOCAINE 2% (20 MG/ML) 5 ML SYRINGE
INTRAMUSCULAR | Status: DC | PRN
  Administered 2021-02-08: 1.5 mg/kg/h via INTRAVENOUS

## 2021-02-08 MED ORDER — BUPIVACAINE LIPOSOME 1.3 % IJ SUSP
INTRAMUSCULAR | Status: DC | PRN
  Administered 2021-02-08: 20 mL

## 2021-02-08 SURGICAL SUPPLY — 72 items
APPLIER CLIP 5 13 M/L LIGAMAX5 (MISCELLANEOUS)
APPLIER CLIP ROT 10 11.4 M/L (STAPLE)
BAG COUNTER SPONGE SURGICOUNT (BAG) IMPLANT
BAG SURGICOUNT SPONGE COUNTING (BAG)
BLADE EXTENDED COATED 6.5IN (ELECTRODE) IMPLANT
CABLE HIGH FREQUENCY MONO STRZ (ELECTRODE) ×3 IMPLANT
CELLS DAT CNTRL 66122 CELL SVR (MISCELLANEOUS) IMPLANT
CLIP APPLIE 5 13 M/L LIGAMAX5 (MISCELLANEOUS) IMPLANT
CLIP APPLIE ROT 10 11.4 M/L (STAPLE) IMPLANT
COVER SURGICAL LIGHT HANDLE (MISCELLANEOUS) ×6 IMPLANT
DECANTER SPIKE VIAL GLASS SM (MISCELLANEOUS) ×3 IMPLANT
DERMABOND ADVANCED (GAUZE/BANDAGES/DRESSINGS)
DERMABOND ADVANCED .7 DNX12 (GAUZE/BANDAGES/DRESSINGS) IMPLANT
DRAIN CHANNEL 19F RND (DRAIN) IMPLANT
DRSG OPSITE POSTOP 4X10 (GAUZE/BANDAGES/DRESSINGS) IMPLANT
DRSG OPSITE POSTOP 4X6 (GAUZE/BANDAGES/DRESSINGS) IMPLANT
DRSG OPSITE POSTOP 4X8 (GAUZE/BANDAGES/DRESSINGS) IMPLANT
ELECT REM PT RETURN 15FT ADLT (MISCELLANEOUS) ×3 IMPLANT
ENDOLOOP SUT PDS II  0 18 (SUTURE)
ENDOLOOP SUT PDS II 0 18 (SUTURE) IMPLANT
GAUZE SPONGE 4X4 12PLY STRL (GAUZE/BANDAGES/DRESSINGS) ×2 IMPLANT
GLOVE SURG ENC MOIS LTX SZ6 (GLOVE) ×6 IMPLANT
GLOVE SURG MICRO LTX SZ6 (GLOVE) ×3 IMPLANT
GLOVE SURG UNDER LTX SZ6.5 (GLOVE) ×6 IMPLANT
GOWN STRL REUS W/TWL LRG LVL3 (GOWN DISPOSABLE) ×6 IMPLANT
GOWN STRL REUS W/TWL XL LVL3 (GOWN DISPOSABLE) ×6 IMPLANT
IRRIG SUCT STRYKERFLOW 2 WTIP (MISCELLANEOUS) ×3
IRRIGATION SUCT STRKRFLW 2 WTP (MISCELLANEOUS) ×1 IMPLANT
KIT TURNOVER KIT A (KITS) ×2 IMPLANT
NDL INSUFFLATION 14GA 120MM (NEEDLE) ×1 IMPLANT
NEEDLE INSUFFLATION 14GA 120MM (NEEDLE) ×3 IMPLANT
PACK COLON (CUSTOM PROCEDURE TRAY) ×3 IMPLANT
PAD POSITIONING PINK XL (MISCELLANEOUS) ×2 IMPLANT
PENCIL SMOKE EVACUATOR (MISCELLANEOUS) IMPLANT
RELOAD STAPLE 60 2.6 WHT THN (STAPLE) IMPLANT
RELOAD STAPLER WHITE 60MM (STAPLE) IMPLANT
RETRACTOR WND ALEXIS 18 MED (MISCELLANEOUS) IMPLANT
RTRCTR WOUND ALEXIS 18CM MED (MISCELLANEOUS)
SCISSORS LAP 5X35 DISP (ENDOMECHANICALS) ×3 IMPLANT
SET TUBE SMOKE EVAC HIGH FLOW (TUBING) ×3 IMPLANT
SHEARS HARMONIC ACE PLUS 36CM (ENDOMECHANICALS) ×3 IMPLANT
SLEEVE XCEL OPT CAN 5 100 (ENDOMECHANICALS) ×6 IMPLANT
SPONGE T-LAP 18X18 ~~LOC~~+RFID (SPONGE) ×4 IMPLANT
STAPLER ECHELON LONG 60 440 (INSTRUMENTS) IMPLANT
STAPLER ECHELON POWER CIR 29 (STAPLE) ×2 IMPLANT
STAPLER RELOAD WHITE 60MM (STAPLE)
STAPLER VISISTAT 35W (STAPLE) IMPLANT
SUT PDS AB 0 CT1 36 (SUTURE) IMPLANT
SUT PDS AB 1 CTX 36 (SUTURE) IMPLANT
SUT PROLENE 2 0 KS (SUTURE) ×2 IMPLANT
SUT PROLENE 2 0 SH DA (SUTURE) IMPLANT
SUT SILK 2 0 (SUTURE) ×2
SUT SILK 2 0 SH CR/8 (SUTURE) ×3 IMPLANT
SUT SILK 2-0 18XBRD TIE 12 (SUTURE) ×1 IMPLANT
SUT SILK 3 0 (SUTURE) ×2
SUT SILK 3 0 SH CR/8 (SUTURE) ×3 IMPLANT
SUT SILK 3-0 18XBRD TIE 12 (SUTURE) ×1 IMPLANT
SUT VIC AB 2-0 SH 27 (SUTURE)
SUT VIC AB 2-0 SH 27X BRD (SUTURE) IMPLANT
SUT VIC AB 3-0 SH 27 (SUTURE)
SUT VIC AB 3-0 SH 27XBRD (SUTURE) IMPLANT
SYS LAPSCP GELPORT 120MM (MISCELLANEOUS)
SYS WOUND ALEXIS 18CM MED (MISCELLANEOUS) ×3
SYSTEM LAPSCP GELPORT 120MM (MISCELLANEOUS) IMPLANT
SYSTEM WOUND ALEXIS 18CM MED (MISCELLANEOUS) IMPLANT
TAPE PAPER 3X10 WHT MICROPORE (GAUZE/BANDAGES/DRESSINGS) ×2 IMPLANT
TOWEL OR NON WOVEN STRL DISP B (DISPOSABLE) ×3 IMPLANT
TRAY FOLEY MTR SLVR 16FR STAT (SET/KITS/TRAYS/PACK) ×2 IMPLANT
TROCAR BLADELESS OPT 5 100 (ENDOMECHANICALS) ×3 IMPLANT
TROCAR XCEL 12X100 BLDLESS (ENDOMECHANICALS) IMPLANT
TUBING CONNECTING 10 (TUBING) ×4 IMPLANT
TUBING CONNECTING 10' (TUBING) ×2

## 2021-02-08 NOTE — Anesthesia Preprocedure Evaluation (Addendum)
Anesthesia Evaluation  Patient identified by MRN, date of birth, ID band Patient awake    Reviewed: Allergy & Precautions, NPO status , Patient's Chart, lab work & pertinent test results  Airway Mallampati: II  TM Distance: >3 FB Neck ROM: Full    Dental  (+) Teeth Intact, Dental Advisory Given   Pulmonary Current Smoker and Patient abstained from smoking.,    breath sounds clear to auscultation       Cardiovascular hypertension, Pt. on medications  Rhythm:Regular Rate:Normal     Neuro/Psych negative neurological ROS  negative psych ROS   GI/Hepatic negative GI ROS, Neg liver ROS,   Endo/Other  negative endocrine ROS  Renal/GU negative Renal ROS     Musculoskeletal negative musculoskeletal ROS (+)   Abdominal Normal abdominal exam  (+)   Peds  Hematology negative hematology ROS (+)   Anesthesia Other Findings   Reproductive/Obstetrics                            Anesthesia Physical Anesthesia Plan  ASA: 2  Anesthesia Plan: General   Post-op Pain Management:    Induction: Intravenous  PONV Risk Score and Plan: 2 and Ondansetron, Dexamethasone and Midazolam  Airway Management Planned: Oral ETT  Additional Equipment: None  Intra-op Plan:   Post-operative Plan: Extubation in OR  Informed Consent: I have reviewed the patients History and Physical, chart, labs and discussed the procedure including the risks, benefits and alternatives for the proposed anesthesia with the patient or authorized representative who has indicated his/her understanding and acceptance.     Dental advisory given  Plan Discussed with: CRNA  Anesthesia Plan Comments:        Anesthesia Quick Evaluation

## 2021-02-08 NOTE — Op Note (Signed)
Operative Note  Kohl Polinsky  [id number]  [id number]  02/08/2021   Surgeon: Phylliss Blakes MD FACS   Assistant: Wenda Low MD FACS   Procedure performed: Laparoscopic reversal of end colostomy, laparoscopic lysis adhesions x30   Preop diagnosis: Colostomy Post-op diagnosis/intraop findings: Same   Specimens: Colostomy Retained items: no  EBL: minimal cc Complications: none   Description of procedure: After obtaining informed consent the patient was taken to the operating room and placed supine on operating room table where general endotracheal anesthesia was initiated, preoperative antibiotics were administered, SCDs applied, and a formal timeout was performed.  Foley catheter was placed and the patient was repositioned in the dorsal lithotomy position with all pressure points appropriately padded.  The colostomy was closed at the level of the skin to minimize contamination.  The perineum and the abdomen were prepped and draped usual sterile fashion .  Peritoneal access was gained with a left subcostal Veress needle and insufflation to 15 mmHg without incident.  A right upper quadrant optical entry 74mm trocar was placed and the abdomen surveyed and confirmed free of any injury from the Veress.  Small bowel adhesions to the anterior abdominal wall and pelvis as well as omental adhesions to the anterior abdominal wall and colostomy were noted.  After infiltration with local (Exparel mixed with quarter percent Marcaine with epinephrine) 2 additional 5 mm trochars were placed in the right hemiabdomen.  A combination of harmonic scalpel and cold sharp dissection with scissors were then used to free adhesions.  Hemostasis was ensured and there was no injury to the bowel in doing this.  We were able to mobilize the small bowel out of the pelvis and the rectal stump was visualized.  This was mobilized slightly again with the harmonic scalpel combined with blunt dissection.  The assistant then went below  to assess this and we were able to pass a 29 mm dilator nearly to the end of the rectal stump, some firm mucous plugs had to be evacuated and the most proximal 2 to 3 cm rectal stump was noted to be slightly stenotic.  We then turned to the colostomy, omental adhesions to this were taken down with harmonic scalpel.   At this point the abdomen was desufflated and the colostomy was mobilized from the skin and surrounding soft tissues with a combination of blunt and cautery dissection.  A site was selected about 4 cm proximal to the end of the colostomy and the pursestring device was applied and the excess colostomy was excised sharply.  A Prolene suture was placed through the pursestring device and then the device was removed.  3 silk sutures were used to secure the pursestring suture.  The colon was dilated up to a 29 mm dilator and then the anvil of the EEA stapler was inserted.  This was secured with the pursestring suture and the colon was replaced into the abdomen.  A wound protector and cap were then applied and the abdomen was reinsufflated.  The colon was noted to easily reach down into the pelvis and no further mobilization was required.  The EEA stapler was inserted and the end was directed anteriorly where the spike was brought out proximal to the area of the narrowed rectum, no rectum was excised.  The anvil was connected to the stapler and the anastomosis was completed.  The pelvis was flooded with irrigation and the proximal descending colon occluded with a bowel clamp while the assistant performed a rigid sigmoidoscopy, tensely insufflating the  rectum and descending colon to perform a leak test which was negative.  The colon was then decompressed.  Irrigation fluid was aspirated.  The abdomen was once again surveyed and confirmed to be free of any injury or abnormality. The Alexis wound protector and trochars were then removed and all gowns, gloves, drapes and instruments were exchanged in accordance  with the clean dirty protocol.  The colostomy site was closed to clean with a running single straight #1 PDS in the posterior fascia starting at either end and tying centrally.  The anterior fascia was then closed with interrupted #1 Novafil's.  The wound was irrigated.  Additional local was infiltrated in the fascia and the subcutaneous tissues.  Skin at the colostomy site was narrowed with a Vicryl deep dermal pursestring suture and this wound was packed.  The trocar sites were closed with subcuticular Monocryl and Dermabond. The patient was then returned to the supine position, awakened, extubated and taken to PACU in stable condition.     All counts were correct at the completion of the case.

## 2021-02-08 NOTE — Transfer of Care (Signed)
Immediate Anesthesia Transfer of Care Note  Patient: Ryan Velasquez  Procedure(s) Performed: LAPAROSCOPIC POSSIBLE OPEN COLOSTOMY REVERSAL (Abdomen)  Patient Location: PACU  Anesthesia Type:General  Level of Consciousness: awake, alert  and oriented  Airway & Oxygen Therapy: Patient Spontanous Breathing and Patient connected to face mask oxygen  Post-op Assessment: Report given to RN and Post -op Vital signs reviewed and stable  Post vital signs: Reviewed and stable  Last Vitals:  Vitals Value Taken Time  BP 156/85 02/08/21 1131  Temp    Pulse 90 02/08/21 1132  Resp 19 02/08/21 1132  SpO2 100 % 02/08/21 1132  Vitals shown include unvalidated device data.  Last Pain:  Vitals:   02/08/21 0631  TempSrc:   PainSc: 0-No pain         Complications: No notable events documented.

## 2021-02-08 NOTE — Anesthesia Procedure Notes (Signed)
Procedure Name: Intubation Date/Time: 02/08/2021 8:59 AM Performed by: Maxwell Caul, CRNA Pre-anesthesia Checklist: Patient identified, Emergency Drugs available, Suction available and Patient being monitored Patient Re-evaluated:Patient Re-evaluated prior to induction Oxygen Delivery Method: Circle system utilized Preoxygenation: Pre-oxygenation with 100% oxygen Induction Type: IV induction Ventilation: Mask ventilation without difficulty Laryngoscope Size: Mac and 4 Grade View: Grade I Tube type: Oral Tube size: 7.5 mm Number of attempts: 1 Airway Equipment and Method: Stylet and Oral airway Placement Confirmation: ETT inserted through vocal cords under direct vision, positive ETCO2 and breath sounds checked- equal and bilateral Secured at: 21 cm Tube secured with: Tape Dental Injury: Teeth and Oropharynx as per pre-operative assessment

## 2021-02-08 NOTE — H&P (Signed)
Ryan Velasquez [id number]  DATE OF ENCOUNTER: 12/07/2020 Interval History:  He has been doing well since her last visit.  He reports his energy level is back to normal, he is as active as he wants to be although does wonder if he is allowed to start doing any heavier weight lifting (he wants to cut up a log).  He has been eating well.  Denies any abdominal pain.  He was able to meet with the wound ostomy nurse [place] and was very appreciative of the excellent advice he received and has had no further issues with his pouching.  He did have his colonoscopy as documented below. 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    Physical Examination:    There were no vitals filed for this visit.   Alert, well-appearing Unlabored respirations Abdomen is soft, nontender.  Incision healed.  Stoma left lower quadrant is pink and productive     Assessment and Plan:    Recovering appropriately status post exploratory laparotomy, sigmoid colectomy with end colostomy for obstruction from diverticulitis on 08/29/20. EGD on 09/13/20 for persistent nausea/vomiting and dx'd with gastroparesis, improved with dietary and medication changes.   Discussed expectations for ongoing recovery, activity limitations if applicable, and reasons to call.   Colonoscopy done 10/10 with Dr. Marney Setting.  Patient was kind enough to bring a copy of this today.  This was notable for a localized area of erythematous and nodular mucosa in the mid transverse colon, biopsies were taken.  Also noted diffuse area of mildly erythematous and friable with spontaneous bleeding mucosa in the rectum and sigmoid consistent with diversion colitis.  Pathology report notes colonic mucosa with mild lamina propria hemorrhage and a small lymphoid aggregate otherwise no significant pathology.    He feels is back to 100% as far as energy level, physical conditioning, though not as strong as he was before  although.  He is cleared to return to any physical activity desired.  Advised him to ease into this.    We will plan to proceed with laparoscopic, possible open colostomy reversal in mid December which will be about 5-1/2 months after his initial operation.   We discussed the procedure in detail including typical perioperative recovery, risks of bleeding, infection, pain, scarring, injury to intra-abdominal structures, anastomotic leak or stricture, need for diverting loop ileostomy or return to the OR, need for further procedures, wound healing problems, hernia, ileus or obstruction, as well as cardiovascular/thromboembolic/pulmonary risks.  Questions welcomed and answered.  We will schedule him for the week of December 12 to 16.  He will need the MiraLAX/Dulcolax/neomycin/Flagyl prep ordered closer to the time of surgery.    Colostomy in place (CMS-HCC)   Gastroparesis   Tobacco use   Alcohol abuse, in remission   Shabana Armentrout Carlye Grippe, MD

## 2021-02-08 NOTE — Progress Notes (Signed)
Transition of Care [facility]) Screening Note  Patient Details  Name: Ryan Velasquez Date of Birth: 11-26-1970  Transition of Care [facility]) CM/SW Contact:    Ewing Schlein, LCSW Phone Number: 02/08/2021, 1:06 PM  Transition of Care Department [facility]) has reviewed patient and no TOC needs have been identified at this time. We will continue to monitor patient advancement through interdisciplinary progression rounds. If new patient transition needs arise, please place a TOC consult.

## 2021-02-09 LAB — CBC
HCT: 39.6 % (ref 39.0–52.0)
Hemoglobin: 13.6 g/dL (ref 13.0–17.0)
MCH: 34.6 pg — ABNORMAL HIGH (ref 26.0–34.0)
MCHC: 34.3 g/dL (ref 30.0–36.0)
MCV: 100.8 fL — ABNORMAL HIGH (ref 80.0–100.0)
Platelets: 254 10*3/uL (ref 150–400)
RBC: 3.93 MIL/uL — ABNORMAL LOW (ref 4.22–5.81)
RDW: 13.4 % (ref 11.5–15.5)
WBC: 20.6 10*3/uL — ABNORMAL HIGH (ref 4.0–10.5)
nRBC: 0 % (ref 0.0–0.2)

## 2021-02-09 LAB — BASIC METABOLIC PANEL
Anion gap: 11 (ref 5–15)
BUN: 7 mg/dL (ref 6–20)
CO2: 26 mmol/L (ref 22–32)
Calcium: 9.3 mg/dL (ref 8.9–10.3)
Chloride: 101 mmol/L (ref 98–111)
Creatinine, Ser: 0.72 mg/dL (ref 0.61–1.24)
GFR, Estimated: >60 mL/min (ref >60)
Glucose, Bld: 95 mg/dL (ref 70–99)
Potassium: 3.9 mmol/L (ref 3.5–5.1)
Sodium: 138 mmol/L (ref 135–145)

## 2021-02-09 LAB — MAGNESIUM: Magnesium: 1.8 mg/dL (ref 1.7–2.4)

## 2021-02-09 MED ORDER — MAGNESIUM SULFATE 2 GM/50ML IV SOLN
2.0000 g | Freq: Once | INTRAVENOUS | Status: AC
  Administered 2021-02-09: 2 g via INTRAVENOUS
  Filled 2021-02-09: qty 50

## 2021-02-09 NOTE — Progress Notes (Signed)
1 Day Post-Op   Subjective/Chief Complaint: No complaints other than some soreness. No flatus yet   Objective: Vital signs in last 24 hours: Temp:  [97.7 F (36.5 C)-98.3 F (36.8 C)] 97.7 F (36.5 C) (12/17 0919) Pulse Rate:  [84-102] 100 (12/17 0919) Resp:  [13-22] 18 (12/17 0919) BP: (140-184)/(64-99) 184/98 (12/17 0919) SpO2:  [98 %-100 %] 99 % (12/17 0919)    Intake/Output from previous day: 12/16 0701 - 12/17 0700 In: 5640 [P.O.:1900; I.V.:3590; IV Piggyback:150] Out: 5346 [Urine:5325; Stool:1; Blood:20] Intake/Output this shift: No intake/output data recorded.  General appearance: alert and cooperative Resp: clear to auscultation bilaterally Cardio: regular rate and rhythm GI: soft, mild tenderness. Incision ok  Lab Results:  Recent Labs    02/09/21 0453  WBC 20.6*  HGB 13.6  HCT 39.6  PLT 254   BMET Recent Labs    02/09/21 0453  NA 138  K 3.9  CL 101  CO2 26  GLUCOSE 95  BUN 7  CREATININE 0.72  CALCIUM 9.3   PT/INR No results for input(s): LABPROT, INR in the last 72 hours. ABG No results for input(s): PHART, HCO3 in the last 72 hours.  Invalid input(s): PCO2, PO2  Studies/Results: No results found.  Anti-infectives: Anti-infectives (From admission, onward)    Start     Dose/Rate Route Frequency Ordered Stop   02/08/21 2100  cefoTEtan (CEFOTAN) 2 g in sodium chloride 0.9 % 100 mL IVPB        2 g 200 mL/hr over 30 Minutes Intravenous Every 12 hours 02/08/21 1314 02/08/21 2136   02/08/21 1400  neomycin (MYCIFRADIN) tablet 1,000 mg  Status:  Discontinued       See Hyperspace for full Linked Orders Report.   1,000 mg Oral 3 times per day 02/08/21 0524 02/08/21 0533   02/08/21 1400  metroNIDAZOLE (FLAGYL) tablet 1,000 mg  Status:  Discontinued       See Hyperspace for full Linked Orders Report.   1,000 mg Oral 3 times per day 02/08/21 0524 02/08/21 0533   02/08/21 0600  cefoTEtan (CEFOTAN) 2 g in sodium chloride 0.9 % 100 mL IVPB        2  g 200 mL/hr over 30 Minutes Intravenous On call to O.R. 02/08/21 0524 02/08/21 0930       Assessment/Plan: s/p Procedure(s): LAPAROSCOPIC POSSIBLE OPEN COLOSTOMY REVERSAL (N/A) Advance diet as tolerated POD 1  Ambulate Likely plan for d/c tomorrow  LOS: 1 day    Chevis Pretty III 02/09/2021

## 2021-02-10 LAB — CBC
HCT: 39.7 % (ref 39.0–52.0)
Hemoglobin: 13.7 g/dL (ref 13.0–17.0)
MCH: 34.5 pg — ABNORMAL HIGH (ref 26.0–34.0)
MCHC: 34.5 g/dL (ref 30.0–36.0)
MCV: 100 fL (ref 80.0–100.0)
Platelets: 265 10*3/uL (ref 150–400)
RBC: 3.97 MIL/uL — ABNORMAL LOW (ref 4.22–5.81)
RDW: 13.5 % (ref 11.5–15.5)
WBC: 13.1 10*3/uL — ABNORMAL HIGH (ref 4.0–10.5)
nRBC: 0 % (ref 0.0–0.2)

## 2021-02-10 LAB — BASIC METABOLIC PANEL
Anion gap: 8 (ref 5–15)
BUN: 8 mg/dL (ref 6–20)
CO2: 28 mmol/L (ref 22–32)
Calcium: 9.2 mg/dL (ref 8.9–10.3)
Chloride: 99 mmol/L (ref 98–111)
Creatinine, Ser: 0.75 mg/dL (ref 0.61–1.24)
GFR, Estimated: >60 mL/min (ref >60)
Glucose, Bld: 91 mg/dL (ref 70–99)
Potassium: 3.7 mmol/L (ref 3.5–5.1)
Sodium: 135 mmol/L (ref 135–145)

## 2021-02-10 MED ORDER — POTASSIUM CHLORIDE 10 MEQ/100ML IV SOLN
10.0000 meq | INTRAVENOUS | Status: AC
  Administered 2021-02-10 (×3): 10 meq via INTRAVENOUS
  Filled 2021-02-10 (×3): qty 100

## 2021-02-10 MED ORDER — METOCLOPRAMIDE HCL 5 MG/ML IJ SOLN
10.0000 mg | Freq: Four times a day (QID) | INTRAMUSCULAR | Status: DC
  Administered 2021-02-10 – 2021-02-12 (×7): 10 mg via INTRAVENOUS
  Filled 2021-02-10 (×7): qty 2

## 2021-02-10 NOTE — Progress Notes (Signed)
2 Days Post-Op   Subjective/Chief Complaint: Complains of more pain overnight. Seems to be a little better this am. Vomited x 1   Objective: Vital signs in last 24 hours: Temp:  [97.7 F (36.5 C)-98.7 F (37.1 C)] 98.7 F (37.1 C) (12/18 0545) Pulse Rate:  [91-100] 94 (12/18 0545) Resp:  [16-18] 18 (12/18 0545) BP: (142-184)/(76-98) 142/76 (12/18 0545) SpO2:  [95 %-99 %] 95 % (12/18 0545) Weight:  [78.4 kg] 78.4 kg (12/18 0500) Last BM Date: 02/09/21  Intake/Output from previous day: 12/17 0701 - 12/18 0700 In: 2380 [P.O.:880; I.V.:1500] Out: 3650 [Urine:3650] Intake/Output this shift: No intake/output data recorded.  General appearance: alert and cooperative Resp: clear to auscultation bilaterally Cardio: regular rate and rhythm GI: soft, moderate tenderness. Incisions look good. Some distension  Lab Results:  Recent Labs    02/09/21 0453 02/10/21 0447  WBC 20.6* 13.1*  HGB 13.6 13.7  HCT 39.6 39.7  PLT 254 265   BMET Recent Labs    02/09/21 0453 02/10/21 0447  NA 138 135  K 3.9 3.7  CL 101 99  CO2 26 28  GLUCOSE 95 91  BUN 7 8  CREATININE 0.72 0.75  CALCIUM 9.3 9.2   PT/INR No results for input(s): LABPROT, INR in the last 72 hours. ABG No results for input(s): PHART, HCO3 in the last 72 hours.  Invalid input(s): PCO2, PO2  Studies/Results: No results found.  Anti-infectives: Anti-infectives (From admission, onward)    Start     Dose/Rate Route Frequency Ordered Stop   02/08/21 2100  cefoTEtan (CEFOTAN) 2 g in sodium chloride 0.9 % 100 mL IVPB        2 g 200 mL/hr over 30 Minutes Intravenous Every 12 hours 02/08/21 1314 02/08/21 2136   02/08/21 1400  neomycin (MYCIFRADIN) tablet 1,000 mg  Status:  Discontinued       See Hyperspace for full Linked Orders Report.   1,000 mg Oral 3 times per day 02/08/21 0524 02/08/21 0533   02/08/21 1400  metroNIDAZOLE (FLAGYL) tablet 1,000 mg  Status:  Discontinued       See Hyperspace for full Linked  Orders Report.   1,000 mg Oral 3 times per day 02/08/21 0524 02/08/21 0533   02/08/21 0600  cefoTEtan (CEFOTAN) 2 g in sodium chloride 0.9 % 100 mL IVPB        2 g 200 mL/hr over 30 Minutes Intravenous On call to O.R. 02/08/21 0524 02/08/21 0930       Assessment/Plan: s/p Procedure(s): LAPAROSCOPIC POSSIBLE OPEN COLOSTOMY REVERSAL (N/A) Seems to have an ileus. Stay on fulls for now Wbc improving. Continue to monitor Ambulate POD 2  LOS: 2 days    Chevis Pretty III 02/10/2021

## 2021-02-11 ENCOUNTER — Encounter (HOSPITAL_COMMUNITY): Payer: Self-pay | Admitting: Surgery

## 2021-02-11 LAB — BASIC METABOLIC PANEL
Anion gap: 6 (ref 5–15)
BUN: 7 mg/dL (ref 6–20)
CO2: 28 mmol/L (ref 22–32)
Calcium: 9.1 mg/dL (ref 8.9–10.3)
Chloride: 102 mmol/L (ref 98–111)
Creatinine, Ser: 0.78 mg/dL (ref 0.61–1.24)
GFR, Estimated: >60 mL/min (ref >60)
Glucose, Bld: 107 mg/dL — ABNORMAL HIGH (ref 70–99)
Potassium: 3.8 mmol/L (ref 3.5–5.1)
Sodium: 136 mmol/L (ref 135–145)

## 2021-02-11 LAB — MAGNESIUM: Magnesium: 2.1 mg/dL (ref 1.7–2.4)

## 2021-02-11 LAB — CBC
HCT: 39.5 % (ref 39.0–52.0)
Hemoglobin: 13.5 g/dL (ref 13.0–17.0)
MCH: 34.4 pg — ABNORMAL HIGH (ref 26.0–34.0)
MCHC: 34.2 g/dL (ref 30.0–36.0)
MCV: 100.5 fL — ABNORMAL HIGH (ref 80.0–100.0)
Platelets: 264 10*3/uL (ref 150–400)
RBC: 3.93 MIL/uL — ABNORMAL LOW (ref 4.22–5.81)
RDW: 13.1 % (ref 11.5–15.5)
WBC: 11.5 10*3/uL — ABNORMAL HIGH (ref 4.0–10.5)
nRBC: 0 % (ref 0.0–0.2)

## 2021-02-11 LAB — SURGICAL PATHOLOGY

## 2021-02-11 MED ORDER — KETOROLAC TROMETHAMINE 15 MG/ML IJ SOLN
15.0000 mg | Freq: Four times a day (QID) | INTRAMUSCULAR | Status: DC | PRN
  Administered 2021-02-11: 21:00:00 15 mg via INTRAVENOUS
  Filled 2021-02-11: qty 1

## 2021-02-11 MED ORDER — POTASSIUM CHLORIDE 10 MEQ/100ML IV SOLN
10.0000 meq | INTRAVENOUS | Status: AC
  Administered 2021-02-11 (×2): 10 meq via INTRAVENOUS
  Filled 2021-02-11: qty 100

## 2021-02-11 MED ORDER — HYDROMORPHONE HCL 1 MG/ML IJ SOLN
0.5000 mg | Freq: Four times a day (QID) | INTRAMUSCULAR | Status: DC | PRN
  Administered 2021-02-11 – 2021-02-12 (×3): 0.5 mg via INTRAVENOUS
  Filled 2021-02-11 (×3): qty 0.5

## 2021-02-11 NOTE — Progress Notes (Signed)
S: Reports initially Saturday night but none yesterday, though he did have some nausea last night.  Had a bowel movement yesterday as well as one this morning.  Passing flatus.  Does feel distended and not much of an appetite.  O: Vitals, labs, intake/output, and orders reviewed at this time.  Afebrile (T-max 99.7), heart rate 90s, 1 recorded episode of 108 last night.  Mildly hypertensive.  Sats 94% on room air.  P.o. 650, urine output 1300+2 occurrences, BM x2.  BMP and magnesium unremarkable, creatinine 0.78, CO2 28.  White blood cell count is downtrending to 11.5 (13.1 yesterday, 20.6 postop day 1).  Hemoglobin is stable at 13.5, platelets stable at 264.  On scheduled Tylenol, did not receive his 2 AM dose, scheduled gabapentin twice a day, and scheduled Reglan starting yesterday afternoon PRNs last 24 hours: Robaxin x 1, Dilaudid x 3, Zofran x 1, oxycodone x2   Gen: A&Ox3, no distress, sleeping comfortably and awakens easily to voice H&N: EOMI, atraumatic, neck supple Chest: unlabored respirations, RRR Abd: soft, appropriately mildly tender, mildly distended, incision(s) c/d/i with Dermabond.  Colostomy site clean and dry without cellulitis or significant drainage. Ext: warm, no edema Neuro: grossly normal  Lines/tubes/drains: PIV  A/P: Postop day 3 status post laparoscopic reversal and colostomy -Postop ileus: Having some bowel function including stool and flatus now.  Hopefully this will improve.  We will continue full liquid diet for today.  Scale back IV fluids. -Ambulate in halls as much as tolerated, continue pulmonary toilet, continue prophylactic Lovenox -Keep potassium greater than 4 and magnesium greater than 2, minimize narcotics as much as possible -If continued bowel function we will plan to advance to soft diet   Phylliss Blakes, MD [facility], [place]

## 2021-02-11 NOTE — Anesthesia Postprocedure Evaluation (Signed)
Anesthesia Post Note  Patient: Ryan Velasquez  Procedure(s) Performed: LAPAROSCOPIC POSSIBLE OPEN COLOSTOMY REVERSAL (Abdomen)     Patient location during evaluation: PACU Anesthesia Type: General Level of consciousness: awake and alert Pain management: pain level controlled Vital Signs Assessment: post-procedure vital signs reviewed and stable Respiratory status: spontaneous breathing, nonlabored ventilation, respiratory function stable and patient connected to nasal cannula oxygen Cardiovascular status: blood pressure returned to baseline and stable Postop Assessment: no apparent nausea or vomiting Anesthetic complications: no   No notable events documented.              Shelton Silvas

## 2021-02-12 ENCOUNTER — Other Ambulatory Visit (HOSPITAL_COMMUNITY): Payer: Self-pay

## 2021-02-12 LAB — BASIC METABOLIC PANEL
Anion gap: 8 (ref 5–15)
BUN: 9 mg/dL (ref 6–20)
CO2: 28 mmol/L (ref 22–32)
Calcium: 9.3 mg/dL (ref 8.9–10.3)
Chloride: 101 mmol/L (ref 98–111)
Creatinine, Ser: 0.81 mg/dL (ref 0.61–1.24)
GFR, Estimated: >60 mL/min (ref >60)
Glucose, Bld: 90 mg/dL (ref 70–99)
Potassium: 3.7 mmol/L (ref 3.5–5.1)
Sodium: 137 mmol/L (ref 135–145)

## 2021-02-12 LAB — MAGNESIUM: Magnesium: 2 mg/dL (ref 1.7–2.4)

## 2021-02-12 MED ORDER — GABAPENTIN 300 MG PO CAPS
300.0000 mg | ORAL_CAPSULE | Freq: Two times a day (BID) | ORAL | 0 refills | Status: DC
  Filled 2021-02-12: qty 10, 5d supply, fill #0

## 2021-02-12 MED ORDER — OXYCODONE HCL 5 MG PO TABS: 5.0000 mg | ORAL_TABLET | Freq: Four times a day (QID) | ORAL | 0 refills | Status: AC | PRN

## 2021-02-12 MED ORDER — OXYCODONE HCL 5 MG PO TABS
5.0000 mg | ORAL_TABLET | Freq: Four times a day (QID) | ORAL | 0 refills | Status: DC | PRN
  Filled 2021-02-12: qty 20, 5d supply, fill #0

## 2021-02-12 MED ORDER — POTASSIUM CHLORIDE 20 MEQ PO PACK
40.0000 meq | PACK | Freq: Once | ORAL | Status: AC
  Administered 2021-02-12: 09:00:00 40 meq via ORAL
  Filled 2021-02-12: qty 2

## 2021-02-12 MED ORDER — GABAPENTIN 300 MG PO CAPS
300.0000 mg | ORAL_CAPSULE | Freq: Two times a day (BID) | ORAL | 0 refills | Status: AC
Start: 2021-02-12 — End: 2021-02-17

## 2021-02-12 NOTE — Discharge Instructions (Signed)
ABDOMINAL SURGERY: POST OP INSTRUCTIONS  DIET: Follow a light bland diet the first 24 hours after arrival home, such as soup, liquids, crackers, etc.  Be sure to include lots of fluids daily.  Avoid fast food or heavy meals as your are more likely to get nauseated.  Eat a low fat the next few days after surgery.  Stay on a soft/ low residue diet for the next 2 weeks.  Take your usually prescribed home medications unless otherwise directed.  PAIN CONTROL: Pain is best controlled by a usual combination of three different methods TOGETHER: Ice/Heat Over the counter pain medication Prescription pain medication Most patients will experience some swelling and bruising around the incisions.  Ice packs or heating pads (30-60 minutes up to 6 times a day) will help. Use ice for the first few days to help decrease swelling and bruising, then switch to heat to help relax tight/sore spots and speed recovery.  Some people prefer to use ice alone, heat alone, alternating between ice & heat.  Experiment to what works for you.  Swelling and bruising can take several weeks to resolve.   It is helpful to take an over-the-counter pain medication regularly for the first few weeks.  Choose one of the following that works best for you: Naproxen (Aleve, etc)  Two 220mg  tabs twice a day Ibuprofen (Advil, etc) Three 200mg  tabs four times a day (every meal & bedtime) Acetaminophen (Tylenol, etc) 500-650mg  four times a day (every meal & bedtime) A  prescription for pain medication (such as oxycodone, hydrocodone, etc) should be given to you upon discharge.  Take your pain medication as prescribed.  If you are having problems/concerns with the prescription medicine (does not control pain, nausea, vomiting, rash, itching, etc), please call [phone] to see if we need to switch you to a different pain medicine that will work better for you and/or control your side effect better. If you need a refill on your pain  medication, please contact your pharmacy.  They will contact our office to request authorization. Prescriptions will not be filled after 5 pm or on week-ends.  Avoid getting constipated.  Between the surgery and the pain medications, it is common to experience some constipation.  Increasing fluid intake and taking a fiber supplement (such as Metamucil, Citrucel, FiberCon, MiraLax, etc) 1-2 times a day regularly will usually help prevent this problem from occurring.  A mild laxative (prune juice, Milk of Magnesia, MiraLax, etc) should be taken according to package directions if there are no bowel movements after 48 hours.   Watch out for diarrhea.  If you have many loose bowel movements, simplify your diet to bland foods & liquids for a few days.  Stop any stool softeners and decrease your fiber supplement.  Switching to mild anti-diarrheal medications (Loperamide/Imodium, Kayopectate, Pepto Bismol) can help.  If this worsens or does not improve, please call us.  Wash / shower every day.  You may shower over the incision / wound.  Remove gauze dressings and allow soap and water to run over the open wound. Re-apply a dry gauze dressing afterwards, and replace this dressing daily and as needed if it becomes saturated. Avoid soaking or swimming until the skin is fully healed.  Continue to shower over incision(s) after the dressing is off. Glue will flake off after 1-2 weeks.    ACTIVITIES as tolerated:   You may resume regular (light) daily activities beginning the next day--such as daily self-care, walking, climbing stairs--gradually increasing  activities as tolerated.  If you can walk 30 minutes without difficulty, it is safe to try more intense activity such as jogging, treadmill, bicycling, low-impact aerobics, swimming, etc. Refrain from the most intensive and strenuous activity such as sit-ups, heavy lifting, contact sports, etc  Refrain from any heavy lifting or straining until 6 weeks after surgery.    DO NOT PUSH THROUGH PAIN.  Let pain be your guide: If it hurts to do something, don't do it.  Pain is your body warning you to avoid that activity for another week until the pain goes down. You may drive when you are no longer taking prescription pain medication, you can comfortably wear a seatbelt, and you can safely maneuver your car and apply brakes. You may have sexual intercourse when it is comfortable.   FOLLOW UP in our office Please call [facility] at [phone] to set up an appointment to see your surgeon in the office for a follow-up appointment approximately 1-2 weeks after your surgery. Make sure that you call for this appointment the day you arrive home to insure a convenient appointment time. 10. IF YOU HAVE DISABILITY OR FAMILY LEAVE FORMS, BRING THEM TO THE OFFICE FOR PROCESSING.  DO NOT GIVE THEM TO YOUR DOCTOR.   WHEN TO CALL us [phone]: Poor pain control Reactions / problems with new medications (rash/itching, nausea, etc)  Fever over 101.5 F (38.5 C) Inability to urinate Nausea and/or vomiting Worsening swelling or bruising Continued bleeding from incision. Increased pain, redness, or drainage from the incision  The clinic staff is available to answer your questions during regular business hours (8:30am-5pm).  Please dont hesitate to call and ask to speak to one of our nurses for clinical concerns.   A surgeon from [facility] is always on call at the hospitals   If you have a medical emergency, go to the nearest emergency room or call 911.    [facility] [address], [address], [place], [place]  [address] ? MAIN: [phone] ? TOLL FREE: [phone] ? FAX [phone] [email or web address]

## 2021-02-12 NOTE — Plan of Care (Signed)
  Problem: Education: Goal: Required Educational Video(s) Outcome: Adequate for Discharge   Problem: Clinical Measurements: Goal: Postoperative complications will be avoided or minimized Outcome: Adequate for Discharge   Problem: Skin Integrity: Goal: Demonstration of wound healing without infection will improve Outcome: Adequate for Discharge   Problem: Education: Goal: Knowledge of General Education information will improve Description: Including pain rating scale, medication(s)/side effects and non-pharmacologic comfort measures Outcome: Adequate for Discharge   Problem: Health Behavior/Discharge Planning: Goal: Ability to manage health-related needs will improve Outcome: Adequate for Discharge   Problem: Clinical Measurements: Goal: Ability to maintain clinical measurements within normal limits will improve Outcome: Adequate for Discharge Goal: Will remain free from infection Outcome: Adequate for Discharge Goal: Diagnostic test results will improve Outcome: Adequate for Discharge Goal: Respiratory complications will improve Outcome: Adequate for Discharge Goal: Cardiovascular complication will be avoided Outcome: Adequate for Discharge   Problem: Activity: Goal: Risk for activity intolerance will decrease Outcome: Adequate for Discharge   Problem: Nutrition: Goal: Adequate nutrition will be maintained Outcome: Adequate for Discharge   Problem: Coping: Goal: Level of anxiety will decrease Outcome: Adequate for Discharge   Problem: Elimination: Goal: Will not experience complications related to bowel motility Outcome: Adequate for Discharge Goal: Will not experience complications related to urinary retention Outcome: Adequate for Discharge   Problem: Pain Managment: Goal: General experience of comfort will improve Outcome: Adequate for Discharge   Problem: Safety: Goal: Ability to remain free from injury will improve Outcome: Adequate for Discharge    Problem: Skin Integrity: Goal: Risk for impaired skin integrity will decrease Outcome: Adequate for Discharge   

## 2021-02-12 NOTE — Discharge Summary (Signed)
Physician Discharge Summary  Patient ID: Ryan Velasquez MRN: [id number] DOB/AGE: November 16, 1970 50 y.o.  Admit date: 02/08/2021 Discharge date: 02/12/2021  Admission Diagnoses: Colostomy  Discharge Diagnoses:  Principal Problem:   History of colostomy reversal   Discharged Condition: good  Hospital Course: He was admitted for routine postoperative care following laparoscopic reversal of end colostomy.  He had a brief mild ileus on postop day 2 and 3 which quickly resolved.  His postoperative hemoglobin was stable and he did have a expected postop leukocytosis which quickly resolved.  He did require potassium replacement.  On the day of discharge he was noted to be tolerating a soft diet without nausea or distention, pain was well controlled with oral medications, and he was having bowel movements and flatus, walking unassisted and in general quite stable for discharge.  Consults: None  Significant Diagnostic Studies: labs: See epic  Discharge Exam: Blood pressure (!) 145/89, pulse 85, temperature 98 F (36.7 C), temperature source Oral, resp. rate 18, height 5\' 8"  (1.727 m), weight 78.4 kg, SpO2 95 %. See rounding note  Disposition: Discharge disposition: 01-Home or Self Care        Allergies as of 02/12/2021   No Known Allergies      Medication List     STOP taking these medications    metoCLOPramide 10 MG tablet Commonly known as: REGLAN       TAKE these medications    amLODipine 5 MG tablet Commonly known as: NORVASC Take 5 mg by mouth daily.   aspirin EC 81 MG tablet Take 81 mg by mouth daily. Swallow whole.   gabapentin 300 MG capsule Commonly known as: NEURONTIN Take 1 capsule (300 mg total) by mouth 2 (two) times daily for 5 days.   oxyCODONE 5 MG immediate release tablet Commonly known as: Oxy IR/ROXICODONE Take 1 tablet (5 mg total) by mouth every 6 (six) hours as needed for moderate or severe pain (Pain not relieved by Tylenol, ibuprofen, rest or  ice).        Follow-up Information     02/14/2021, MD Follow up in 2 week(s).   Specialty: General Surgery Contact information: [address] [place] [phone]                 Signed: [phone] 02/12/2021, 11:20 AM

## 2021-02-12 NOTE — Progress Notes (Signed)
S: Uneventful night, denies any nausea or emesis, tolerating full liquids well.  Reports he continues to have fair amount of flatus and has had several small bowel movements this morning, nonbloody.   O: Vitals, labs, intake/output, and orders reviewed at this time. Tmax 98.2. HR 85-101, 95% RA, mildly hypertensive. PO 940; UOP 2620, BM x 3. BMP- K 3.7 despite multiple replacements, Mg 2   PRNs last 24h: Oxycodone x 2, toradol x 1, dilaudid x 3 Scheduled: tylenol, gabapentin, reglan  Gen: A&Ox3, no distress  H&N: EOMI, atraumatic, neck supple Chest: unlabored respirations, RRR Abd: soft, not tender, minimally distended, incision(s) c/d/i with Dermabond, evolving ecchymosis around most inferior laparoscopic port site.  Stoma site is clean and dry without any surrounding cellulitis, scant serosanguineous drainage Ext: warm, no edema Neuro: grossly normal  Lines/tubes/drains: PIV  A/P:  Postop day 4 status post laparoscopic reversal and colostomy  -Postop ileus: Resolved, Advance to soft diet, stop IVF -Ambulate in halls as much as tolerated, continue pulmonary toilet, continue prophylactic Lovenox -Keep potassium greater than 4 and magnesium greater than 2, replace potassium for hypokalemia PO today. Minimize narcotics as much as possible -Discharge home today   Phylliss Blakes, MD [facility], [place]

## 2021-11-21 ENCOUNTER — Other Ambulatory Visit (HOSPITAL_COMMUNITY): Payer: Self-pay

## 2023-05-22 IMAGING — CT CT ABD-PELV W/ CM
2 of 5 series · 15 of 46 positions shown, 17 images · IV contrast (APPLIED)
Comparison: CT abdomen pelvis 09/04/2020

CLINICAL DATA: Abdominal distension Nausea/vomiting Nanto/Ericelia Muxfeldt
with prolonged ileus and leukocytosi

EXAM:
CT ABDOMEN AND PELVIS WITH CONTRAST
TECHNIQUE: Multidetector CT imaging of the abdomen and pelvis was performed
using the standard protocol following bolus administration of
intravenous contrast.
CONTRAST:  100mL OMNIPAQUE IOHEXOL 300 MG/ML  SOLN

[Series 3: abd/ pelvis 5.0 i30f 2 · axial · 0.91mm/px · z∈[-439,-4]mm · 12 of 97 slices shown, 14 images]
[im 5/97  soft-tissue]
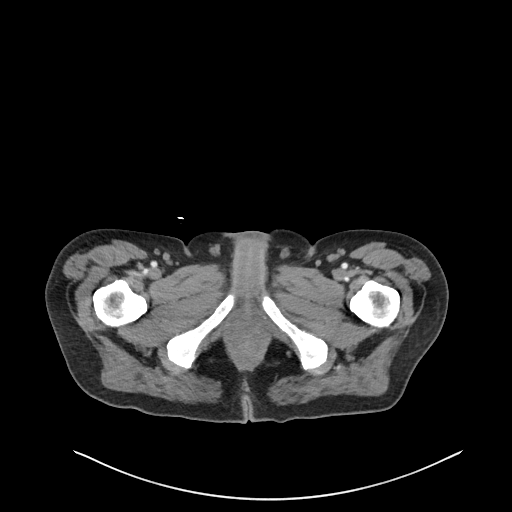
[im 5/97  bone]
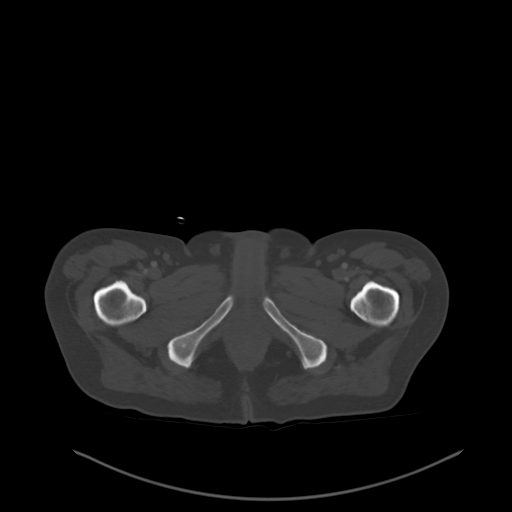
[im 15/97  soft-tissue]
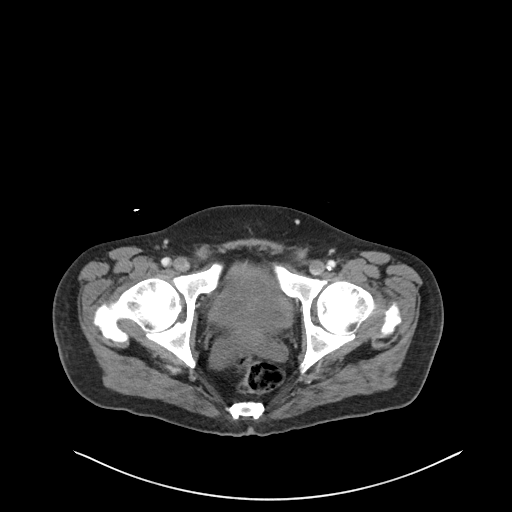
[im 20/97  soft-tissue]
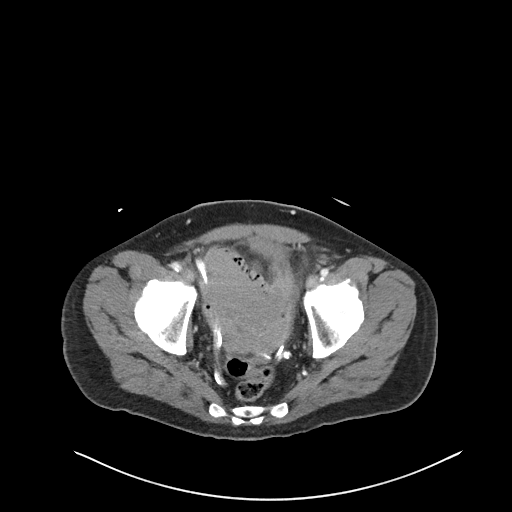
[im 29/97  soft-tissue]
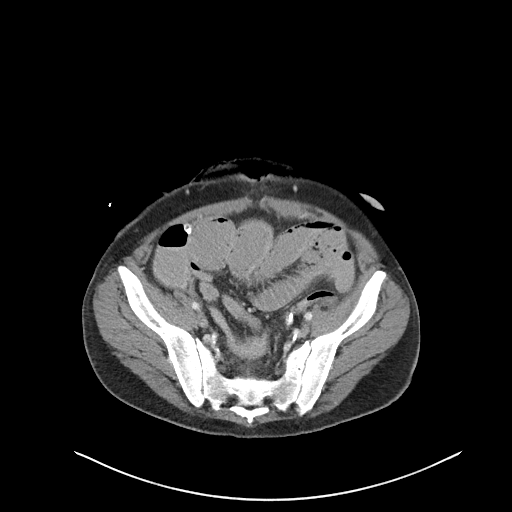
[im 39/97  soft-tissue]
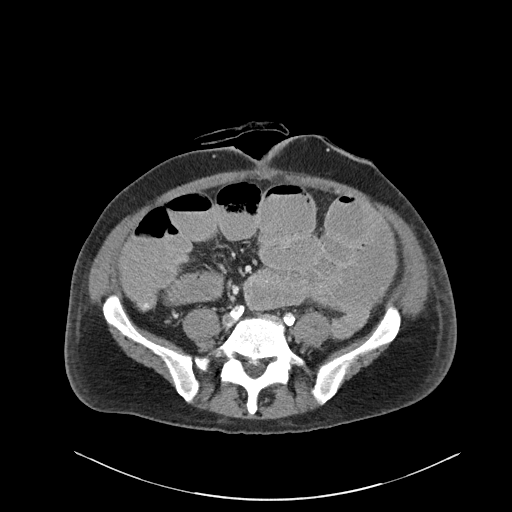
[im 44/97  soft-tissue]
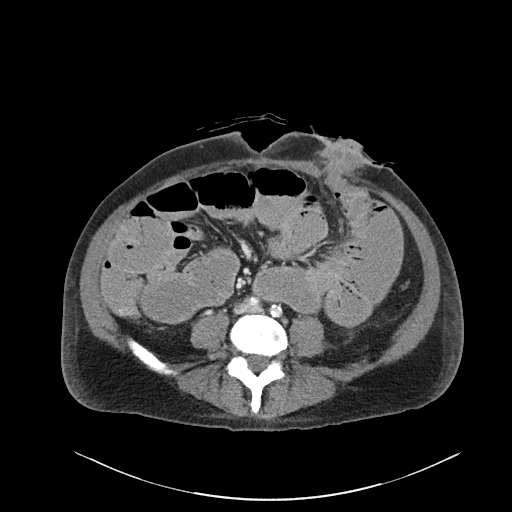
[im 53/97  soft-tissue]
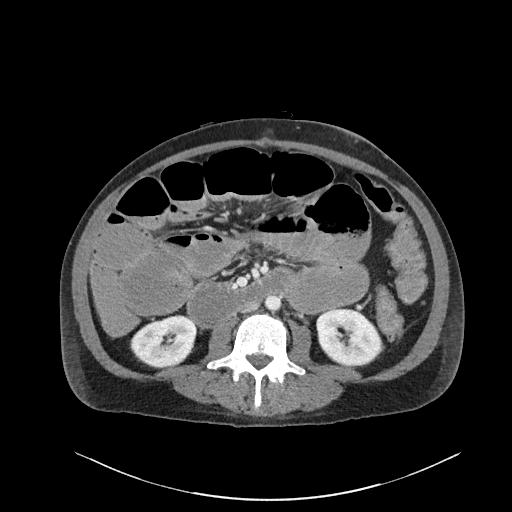
[im 58/97  soft-tissue]
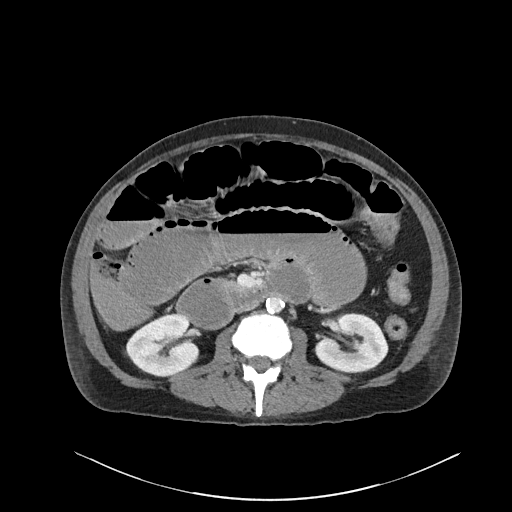
[im 68/97  soft-tissue]
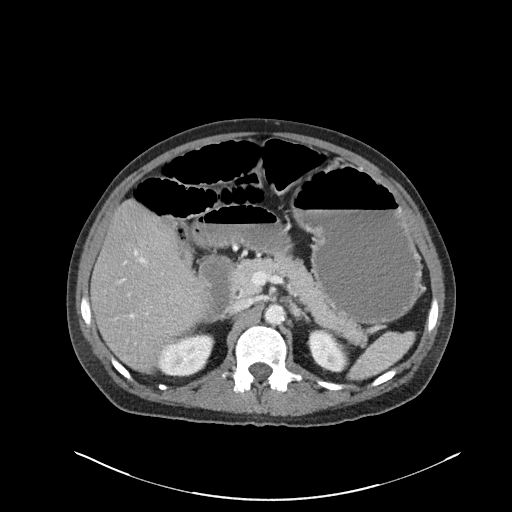
[im 68/97  bone]
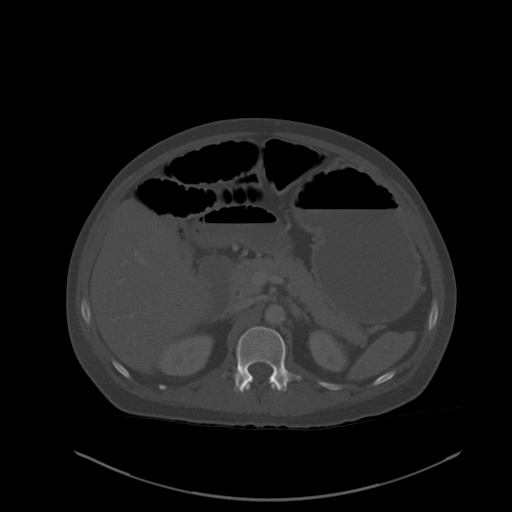
[im 77/97  soft-tissue]
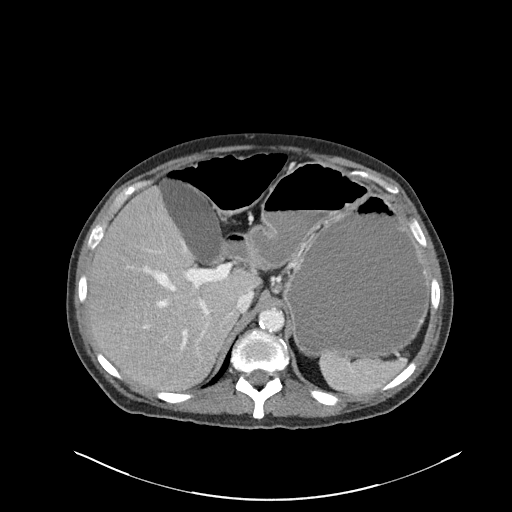
[im 82/97  soft-tissue]
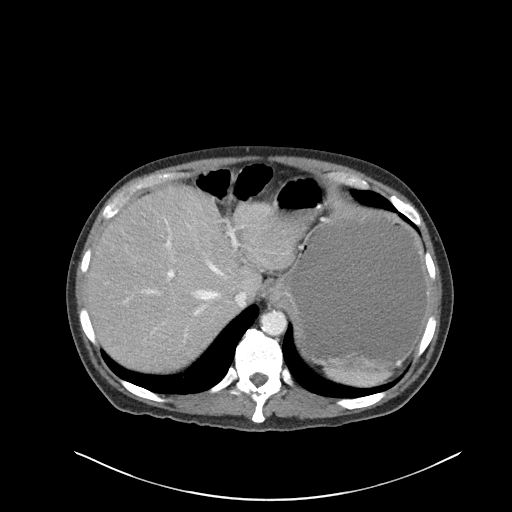
[im 92/97  soft-tissue]
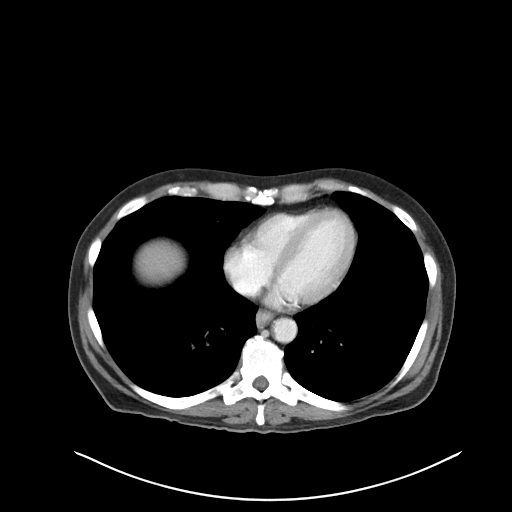

[Series 6: coronal soft tissue · coronal · 0.91mm/px · 3 of 107 slices shown]
[im 36/107  soft-tissue]
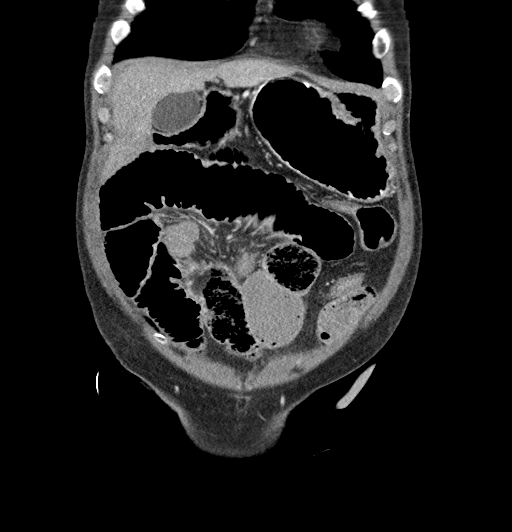
[im 48/107  soft-tissue]
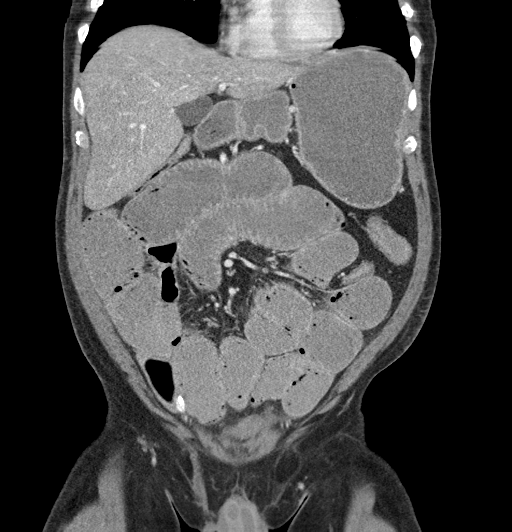
[im 59/107  soft-tissue]
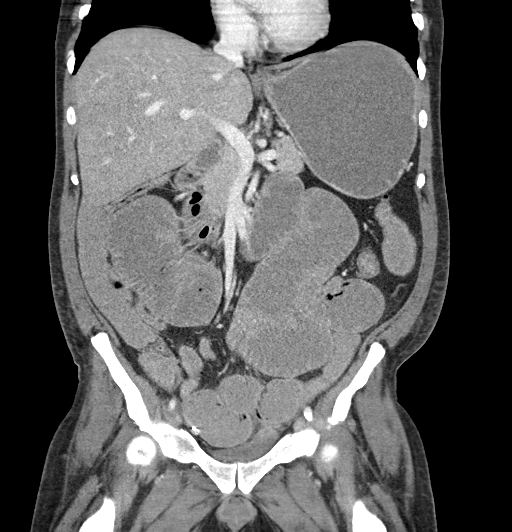

[15 of 46 positions shown; findings below may reference images not displayed]

FINDINGS: Lower chest: Improving bilateral lower lobe tree-in-bud nodularity.
Coronary artery calcifications.

Hepatobiliary: No focal liver abnormality. No gallstones,
gallbladder wall thickening, or pericholecystic fluid. No biliary
dilatation.

Pancreas: No focal lesion. Normal pancreatic contour. No surrounding
inflammatory changes. No main pancreatic ductal dilatation.

Spleen: Normal in size without focal abnormality.

Adrenals/Urinary Tract:

No adrenal nodule bilaterally.

Bilateral kidneys enhance symmetrically. No hydronephrosis. No
hydroureter.

The urinary bladder is unremarkable.

Stomach/Bowel: Surgical changes related to Oi Mei Gallaza formation
and left lower lobe and colostomy formation. Gastric dilatation with
fluid. Multiple loops of small bowel are distended with fluid
measuring up to at least 5.5 cm on axial imaging. No definite
transition point identified; however, the terminal ileum is again
noted to be mostly decompressed with a similar region of change in
caliber within the left lower abdomen that is poorly visualized (6:
60-75). No bowel wall thickening of the small bowel or pneumatosis.
Gas and fluid noted within the large bowel. No large bowel wall
thickening. Scattered colonic diverticulosis. Appendix appears
normal.

Vascular/Lymphatic: No abdominal aorta or iliac aneurysm. Severe
atherosclerotic plaque of the aorta and its branches. No abdominal,
pelvic, or inguinal lymphadenopathy.

Reproductive: Prostate is unremarkable.

Other: Right lower quadrant surgical drain with tip terminating in
the left pelvis. No intraperitoneal free fluid. No intraperitoneal
free gas. No organized fluid collection.

Musculoskeletal:

Midline anterior abdominal incision with persistent open soft tissue
defect. No associated organized fluid collection or fat stranding to
suggest infection.

No suspicious lytic or blastic osseous lesions. No acute displaced
fracture. Multilevel degenerative changes of the spine.
IMPRESSION: 1. Similar-appearing fluid dilated stomach and small bowel with a
mostly decompressed terminal ileum and a similar change in caliber
of the small bowel within the left lower abdomen that is poorly
visualized. Finding may represent a postop ileus versus a not
excluded partial small bowel obstruction. Recommend enteric tube
placement given gastric and proximal/mid small bowel dilatation with
fluid. Consider use of both IV and PO contrast in future imaging
given difficulty of visualizing the small bowel change in caliber in
the left lower abdomen.
2. Status post left end colostomy formation and Oi Mei Gallaza
formation.
3. Scattered colonic diverticulosis with no acute diverticulitis.
4. Improving bilateral lower lobe tree-in-bud nodularity likely
representing a resolving infection/inflammation of the lungs.
5.  Aortic Atherosclerosis ([id number]).

## 2023-05-23 IMAGING — DX DG ABDOMEN 1V
1 series · 1 of 1 positions shown · non-contrast
Comparison: Portable exam 7663 hours compared to 09/08/2020

CLINICAL DATA: Nasogastric tube placement post abdominal surgery on
08/29/2020

EXAM:
ABDOMEN - 1 VIEW

[abdomen]
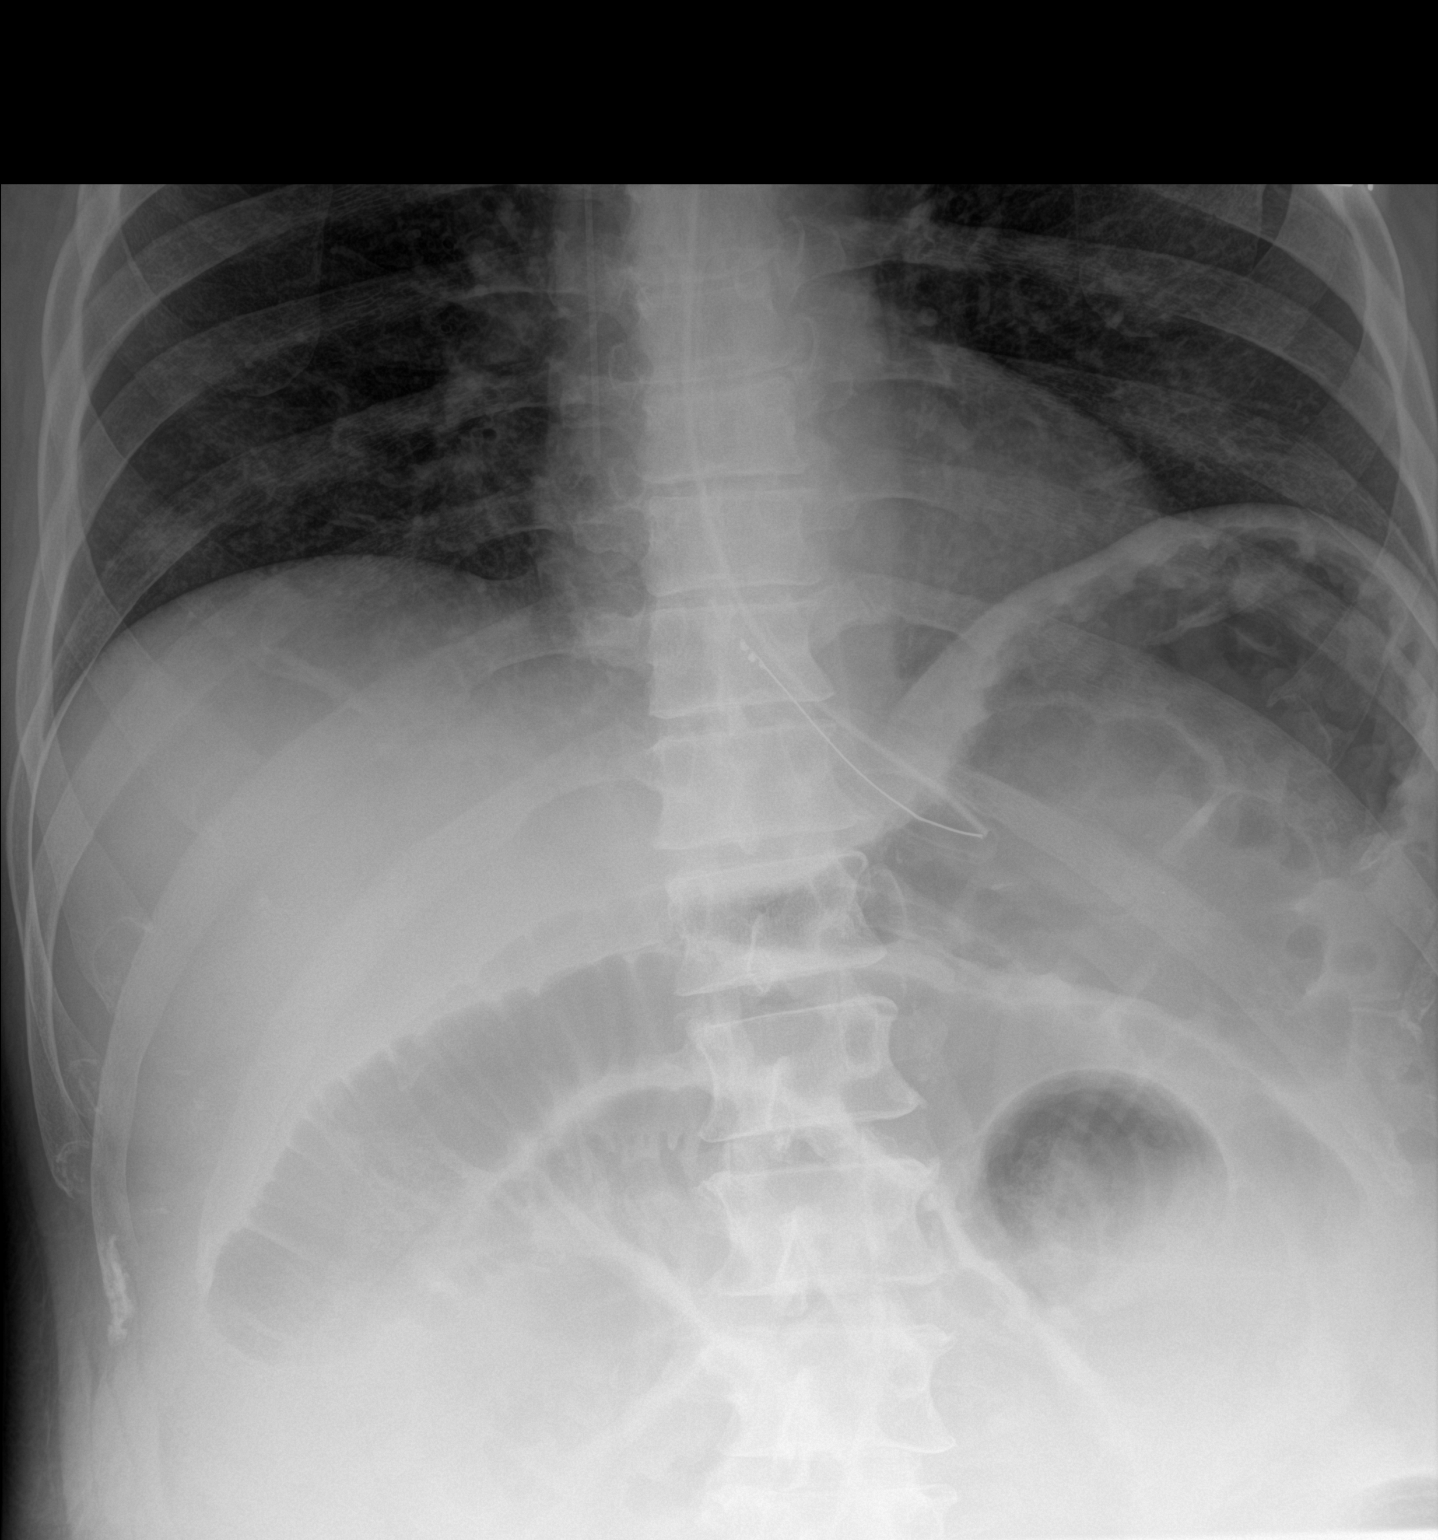

[1 of 1 positions shown; findings below may reference images not displayed]

FINDINGS: Nasogastric tube coiled in distal esophagus.

Persistent dilatation of small bowel loops in the abdomen.

Gas also present in stomach and at distal transverse colon.

Lung bases clear.
IMPRESSION: Nasogastric tube coiled in distal esophagus; recommend withdrawal
and replacement.

Findings called to Shajhan RN on 09/11/2020 at 5595 hours.

## 2023-05-23 IMAGING — DX DG ABD PORTABLE 1V
1 series · 1 of 1 positions shown · non-contrast
Comparison: Same day.

CLINICAL DATA: Nasogastric tube.

EXAM:
PORTABLE ABDOMEN - 1 VIEW

[abdomen]
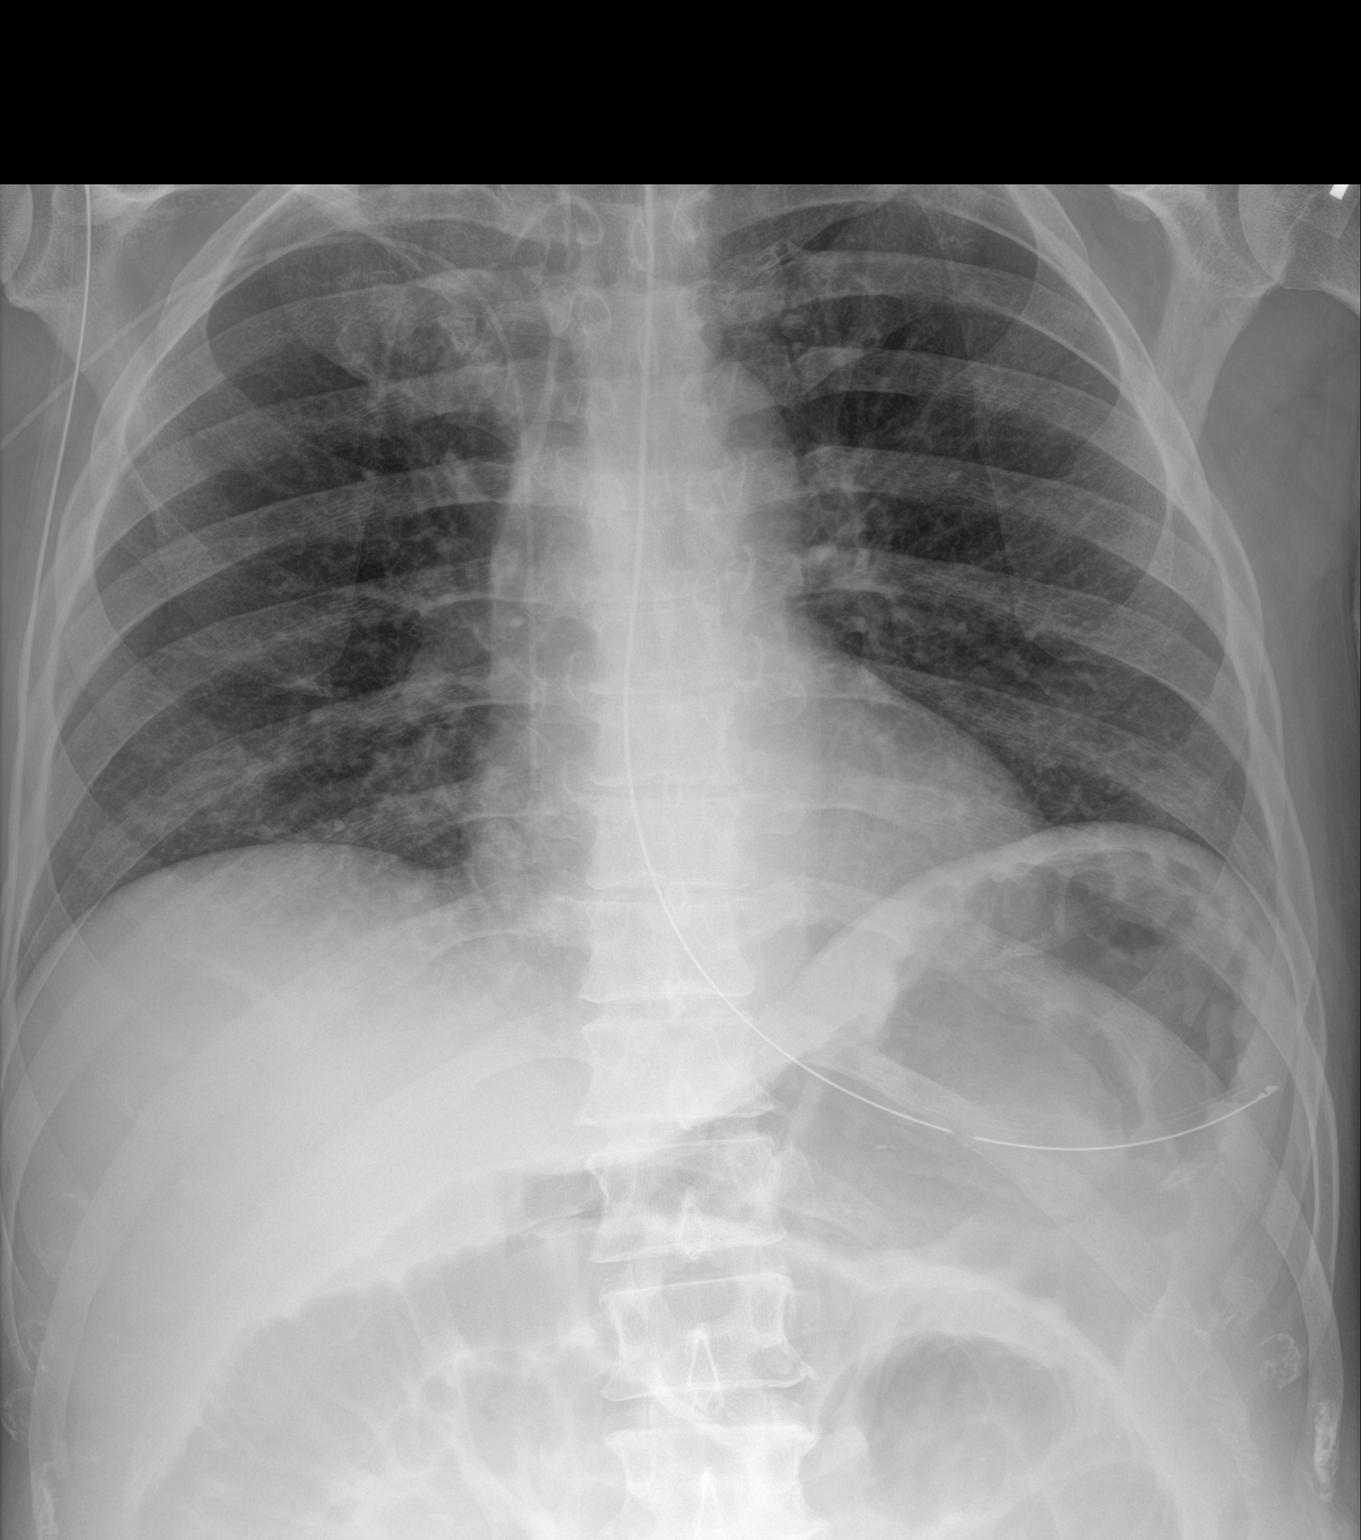

[1 of 1 positions shown; findings below may reference images not displayed]

FINDINGS: The bowel gas pattern is normal. No radio-opaque calculi or other
significant radiographic abnormality are seen. Right-sided PICC line
is unchanged in position. Distal tip of nasogastric tube is seen in
proximal stomach.
IMPRESSION: Distal tip of nasogastric tube seen in proximal stomach.
